# Patient Record
Sex: Female | Born: 1985 | Race: Black or African American | Hispanic: No | Marital: Married | Smoking: Never smoker
Health system: Southern US, Community
[De-identification: ages and names within clinical notes are randomized; demographics above are authoritative.]

## PROBLEM LIST (undated history)

## (undated) DIAGNOSIS — F319 Bipolar disorder, unspecified: Secondary | ICD-10-CM

## (undated) DIAGNOSIS — M1990 Unspecified osteoarthritis, unspecified site: Secondary | ICD-10-CM

## (undated) DIAGNOSIS — E119 Type 2 diabetes mellitus without complications: Secondary | ICD-10-CM

## (undated) DIAGNOSIS — K219 Gastro-esophageal reflux disease without esophagitis: Secondary | ICD-10-CM

## (undated) DIAGNOSIS — E079 Disorder of thyroid, unspecified: Secondary | ICD-10-CM

## (undated) DIAGNOSIS — R51 Headache: Secondary | ICD-10-CM

## (undated) DIAGNOSIS — I10 Essential (primary) hypertension: Secondary | ICD-10-CM

## (undated) DIAGNOSIS — R519 Headache, unspecified: Secondary | ICD-10-CM

## (undated) DIAGNOSIS — E039 Hypothyroidism, unspecified: Secondary | ICD-10-CM

## (undated) HISTORY — DX: Gastro-esophageal reflux disease without esophagitis: K21.9

## (undated) HISTORY — DX: Headache, unspecified: R51.9

## (undated) HISTORY — DX: Hypothyroidism, unspecified: E03.9

## (undated) HISTORY — DX: Bipolar disorder, unspecified: F31.9

## (undated) HISTORY — DX: Unspecified osteoarthritis, unspecified site: M19.90

## (undated) HISTORY — DX: Headache: R51

---

## 2013-06-26 ENCOUNTER — Emergency Department (HOSPITAL_COMMUNITY): Payer: Self-pay

## 2013-06-26 ENCOUNTER — Encounter (HOSPITAL_COMMUNITY): Payer: Self-pay | Admitting: *Deleted

## 2013-06-26 ENCOUNTER — Emergency Department (HOSPITAL_COMMUNITY)
Admission: EM | Admit: 2013-06-26 | Discharge: 2013-06-26 | Disposition: A | Discharge status: Home / Self Care | Payer: Self-pay | Attending: Emergency Medicine | Admitting: Emergency Medicine

## 2013-06-26 DIAGNOSIS — F319 Bipolar disorder, unspecified: Secondary | ICD-10-CM | POA: Insufficient documentation

## 2013-06-26 DIAGNOSIS — E119 Type 2 diabetes mellitus without complications: Secondary | ICD-10-CM | POA: Insufficient documentation

## 2013-06-26 DIAGNOSIS — R110 Nausea: Secondary | ICD-10-CM | POA: Insufficient documentation

## 2013-06-26 DIAGNOSIS — Z888 Allergy status to other drugs, medicaments and biological substances status: Secondary | ICD-10-CM | POA: Insufficient documentation

## 2013-06-26 DIAGNOSIS — Z794 Long term (current) use of insulin: Secondary | ICD-10-CM | POA: Insufficient documentation

## 2013-06-26 DIAGNOSIS — M545 Low back pain, unspecified: Secondary | ICD-10-CM | POA: Insufficient documentation

## 2013-06-26 DIAGNOSIS — M79609 Pain in unspecified limb: Secondary | ICD-10-CM | POA: Insufficient documentation

## 2013-06-26 DIAGNOSIS — Z79899 Other long term (current) drug therapy: Secondary | ICD-10-CM | POA: Insufficient documentation

## 2013-06-26 DIAGNOSIS — N938 Other specified abnormal uterine and vaginal bleeding: Secondary | ICD-10-CM | POA: Insufficient documentation

## 2013-06-26 DIAGNOSIS — N912 Amenorrhea, unspecified: Secondary | ICD-10-CM | POA: Insufficient documentation

## 2013-06-26 DIAGNOSIS — N8300 Follicular cyst of ovary, unspecified side: Secondary | ICD-10-CM

## 2013-06-26 DIAGNOSIS — L738 Other specified follicular disorders: Secondary | ICD-10-CM | POA: Insufficient documentation

## 2013-06-26 DIAGNOSIS — M549 Dorsalgia, unspecified: Secondary | ICD-10-CM

## 2013-06-26 DIAGNOSIS — R109 Unspecified abdominal pain: Secondary | ICD-10-CM | POA: Insufficient documentation

## 2013-06-26 DIAGNOSIS — R350 Frequency of micturition: Secondary | ICD-10-CM | POA: Insufficient documentation

## 2013-06-26 DIAGNOSIS — N949 Unspecified condition associated with female genital organs and menstrual cycle: Secondary | ICD-10-CM | POA: Insufficient documentation

## 2013-06-26 DIAGNOSIS — E079 Disorder of thyroid, unspecified: Secondary | ICD-10-CM | POA: Insufficient documentation

## 2013-06-26 DIAGNOSIS — Z3202 Encounter for pregnancy test, result negative: Secondary | ICD-10-CM | POA: Insufficient documentation

## 2013-06-26 HISTORY — DX: Disorder of thyroid, unspecified: E07.9

## 2013-06-26 HISTORY — DX: Bipolar disorder, unspecified: F31.9

## 2013-06-26 HISTORY — DX: Type 2 diabetes mellitus without complications: E11.9

## 2013-06-26 LAB — URINALYSIS, ROUTINE W REFLEX MICROSCOPIC
Glucose, UA: NEGATIVE mg/dL
Specific Gravity, Urine: 1.024 (ref 1.005–1.030)
Urobilinogen, UA: 1 mg/dL (ref 0.0–1.0)

## 2013-06-26 LAB — URINE MICROSCOPIC-ADD ON

## 2013-06-26 LAB — POCT PREGNANCY, URINE: Preg Test, Ur: NEGATIVE

## 2013-06-26 MED ORDER — KETOROLAC TROMETHAMINE 60 MG/2ML IM SOLN
60.0000 mg | Freq: Once | INTRAMUSCULAR | Status: AC
  Administered 2013-06-26: 60 mg via INTRAMUSCULAR
  Filled 2013-06-26: qty 2

## 2013-06-26 MED ORDER — HYDROCODONE-ACETAMINOPHEN 5-325 MG PO TABS: 1.0000 | ORAL_TABLET | ORAL | Status: DC | PRN

## 2013-06-26 NOTE — ED Notes (Signed)
Pt is here with lower back pain that radiates down to legs.  Pt is here with complaints of yesterday feeling movement in her abdomen and a big bulge. Pt had negative pregnancy test.  LMP 04/12/13.

## 2013-06-26 NOTE — ED Notes (Signed)
Robyn, PA at bedside  

## 2013-06-26 NOTE — ED Provider Notes (Signed)
History    CSN: [id number] Arrival date & time 06/26/13  1247  First MD Initiated Contact with Patient 06/26/13 1537     Chief Complaint  Patient presents with  . Back Pain   (Consider location/radiation/quality/duration/timing/severity/associated sxs/prior Treatment) HPI Comments: 27 year old female with a past medical history of diabetes, hypothyroidism, bipolar disorder presents to the emergency department complaining of left-sided lower back pain radiating down her left leg beginning 4 days ago. Patient states she cannot recall what she was doing when the pain began, described as constant, achy rated 7/10. Try taking ibuprofen without relief. Radiation down her left leg more prominent when she goes from a seated to standing position. Denies numbness or tingling down her extremities. Admits to associated increased urinary frequency without urgency or dysuria. States she has not had a menstrual period since April 23, took multiple pregnancy tests which were negative. Last night she had a "moving" feeling in the left side of her abdomen that moved when she tried to touch the area which was bulging. States it feels like she had fluttering. She has had some vaginal spotting over the past few days. Denies nausea, vomiting, fever or chills. No loss of control of bowels or bladder or saddle anesthesia. States she is moving her bowels normally.  Patient is a 27 y.o. female presenting with back pain. The history is provided by the patient.  Back Pain Associated symptoms: no abdominal pain, no dysuria and no fever    Past Medical History  Diagnosis Date  . Diabetes mellitus without complication   . Thyroid disease   . Bipolar 1 disorder    History reviewed. No pertinent past surgical history. No family history on file. History  Substance Use Topics  . Smoking status: Never Smoker   . Smokeless tobacco: Not on file  . Alcohol Use: No   OB History   Grav Para Term Preterm Abortions TAB SAB  Ect Mult Living                 Review of Systems  Constitutional: Negative for fever and chills.  Gastrointestinal: Positive for nausea. Negative for vomiting and abdominal pain.  Genitourinary: Positive for frequency, vaginal bleeding and menstrual problem. Negative for dysuria, urgency and difficulty urinating.  Musculoskeletal: Positive for back pain. Negative for gait problem.  All other systems reviewed and are negative.    Allergies  Flonase and Naproxen  Home Medications   Current Outpatient Rx  Name  Route  Sig  Dispense  Refill  . Insulin Aspart (NOVOLOG FLEXPEN [place])   Subcutaneous   Inject 2-8 Units into the skin. 6-8 units with meals.         . Insulin Glargine (LANTUS SOLOSTAR) 100 UNIT/ML SOPN   Subcutaneous   Inject 42 Units into the skin at bedtime.         Marland Kitchen levothyroxine (SYNTHROID, LEVOTHROID) 200 MCG tablet   Oral   Take 200 mcg by mouth daily before breakfast.         . lovastatin (MEVACOR) 20 MG tablet   Oral   Take 20 mg by mouth at bedtime.         . metFORMIN (GLUCOPHAGE) 500 MG tablet   Oral   Take 500 mg by mouth daily.          BP 134/81  Pulse 72  Temp(Src) 98.6 F (37 C) (Oral)  Resp 18  SpO2 97%  LMP 04/12/2013 Physical Exam  Nursing note and vitals reviewed. Constitutional:  She is oriented to person, place, and time. She appears well-developed. No distress.  Obese  HENT:  Head: Normocephalic and atraumatic.  Mouth/Throat: Oropharynx is clear and moist.  Eyes: Conjunctivae are normal.  Neck: Normal range of motion. Neck supple.  Cardiovascular: Normal rate, regular rhythm, normal heart sounds and intact distal pulses.   Pulmonary/Chest: Effort normal and breath sounds normal.  Abdominal: Soft. Bowel sounds are normal. She exhibits no distension. There is tenderness. There is no rigidity, no rebound, no guarding and no CVA tenderness.    Genitourinary: Uterus is not tender. Cervix exhibits no motion tenderness, no  discharge and no friability. Right adnexum displays no mass, no tenderness and no fullness. Left adnexum displays tenderness. Left adnexum displays no mass and no fullness. No erythema, tenderness or bleeding around the vagina. No vaginal discharge found.  Pelvic exam limited by patient's body habitus.  Musculoskeletal: Normal range of motion. She exhibits no edema.       Lumbar back: She exhibits tenderness. She exhibits normal range of motion, no bony tenderness and normal pulse.       Back:  Neurological: She is alert and oriented to person, place, and time. She has normal strength. No sensory deficit. Gait normal.  Skin: Skin is warm and dry. She is not diaphoretic.  Psychiatric: She has a normal mood and affect. Her behavior is normal.    ED Course  Procedures (including critical care time) Labs Reviewed  URINALYSIS, ROUTINE W REFLEX MICROSCOPIC - Abnormal; Notable for the following:    Color, Urine AMBER (*)    APPearance HAZY (*)    Hgb urine dipstick MODERATE (*)    Bilirubin Urine SMALL (*)    Ketones, ur 15 (*)    All other components within normal limits  URINE MICROSCOPIC-ADD ON - Abnormal; Notable for the following:    Squamous Epithelial / LPF FEW (*)    Bacteria, UA FEW (*)    All other components within normal limits  WET PREP, GENITAL  GC/CHLAMYDIA PROBE AMP  POCT PREGNANCY, URINE   US Transvaginal Non-ob  06/26/2013   *RADIOLOGY REPORT*  Clinical Data: Back and pelvic pain/cramping.  TRANSABDOMINAL AND TRANSVAGINAL ULTRASOUND OF PELVIS Technique:  Both transabdominal and transvaginal ultrasound examinations of the pelvis were performed. Transabdominal technique was performed for global imaging of the pelvis including uterus, ovaries, adnexal regions, and pelvic cul-de-sac.  It was necessary to proceed with endovaginal exam following the transabdominal exam to visualize the uterus, ovaries, and adnexa  .  Comparison:  None  Findings:  Uterus: 8.2 x 3.7 x 5.7 cm. Normal  in morphology.  Endometrium: Normal, 2 mm.  Right ovary:  4.5 x 2.6 x 2.4 cm. Normal in morphology.  Left ovary: 4.4 x 2.5 x 3.2 cm.  Dominant follicle at 2.5 x 2.1 x 2.5 cm.  Other findings: No free fluid  IMPRESSION: Left ovarian dominant follicle of 2.5 cm.  Otherwise, normal pelvic ultrasound for age.   Original Report Authenticated By: Jeronimo Greaves, M.D.   US Pelvis Complete  06/26/2013   *RADIOLOGY REPORT*  Clinical Data: Back and pelvic pain/cramping.  TRANSABDOMINAL AND TRANSVAGINAL ULTRASOUND OF PELVIS Technique:  Both transabdominal and transvaginal ultrasound examinations of the pelvis were performed. Transabdominal technique was performed for global imaging of the pelvis including uterus, ovaries, adnexal regions, and pelvic cul-de-sac.  It was necessary to proceed with endovaginal exam following the transabdominal exam to visualize the uterus, ovaries, and adnexa  .  Comparison:  None  Findings:  Uterus: 8.2 x 3.7 x 5.7 cm. Normal in morphology.  Endometrium: Normal, 2 mm.  Right ovary:  4.5 x 2.6 x 2.4 cm. Normal in morphology.  Left ovary: 4.4 x 2.5 x 3.2 cm.  Dominant follicle at 2.5 x 2.1 x 2.5 cm.  Other findings: No free fluid  IMPRESSION: Left ovarian dominant follicle of 2.5 cm.  Otherwise, normal pelvic ultrasound for age.   Original Report Authenticated By: Jeronimo Greaves, M.D.   1. Follicle cyst   2. Back pain     MDM  Patient with back pain, lower abdominal tenderness in pelvic region, vaginal spotting, amenorrhea. Pregnancy negative, UA without infection. Will perform pelvic exam, obtain cultures, pelvic US. 5:21 PM Pelvic exam limited by patient's body habitus. No CMT. Left adnexal tenderness. US showing L dominant follicle of 2.5 cm. Pain improved with toradol. She is stable for discharge, f/u with GYN. Resources for PCP given. Vicodin for pain. Return precautions discussed. Patient states understanding of plan and is agreeable.   Trevor Mace, PA-C 06/26/13 1722

## 2013-06-26 NOTE — ED Notes (Signed)
Pt alert and mentating appropriately upon d/c. Pt given d/c teaching and prescriptions. Pt verbalizes understanding and has no further questions upon d/c. Pt given resource guide and educated on use and follow up care instructions. NAD noted upon d/c. Pt ambulatory upon d/c. Pt states they are taking the bus home.

## 2013-06-27 LAB — GC/CHLAMYDIA PROBE AMP: GC Probe RNA: NEGATIVE

## 2013-06-27 NOTE — ED Provider Notes (Signed)
  Medical screening examination/treatment/procedure(s) were performed by non-physician practitioner and as supervising physician I was immediately available for consultation/collaboration.    Gerhard Munch, MD 06/27/13 0030

## 2014-01-06 ENCOUNTER — Encounter (HOSPITAL_COMMUNITY): Payer: Self-pay | Admitting: Emergency Medicine

## 2014-01-06 ENCOUNTER — Emergency Department (HOSPITAL_COMMUNITY)
Admission: EM | Admit: 2014-01-06 | Discharge: 2014-01-06 | Disposition: A | Discharge status: Home / Self Care | Payer: Medicaid - Out of State | Attending: Emergency Medicine | Admitting: Emergency Medicine

## 2014-01-06 DIAGNOSIS — M545 Low back pain, unspecified: Secondary | ICD-10-CM | POA: Insufficient documentation

## 2014-01-06 DIAGNOSIS — G8929 Other chronic pain: Secondary | ICD-10-CM | POA: Insufficient documentation

## 2014-01-06 DIAGNOSIS — Z794 Long term (current) use of insulin: Secondary | ICD-10-CM | POA: Insufficient documentation

## 2014-01-06 DIAGNOSIS — E119 Type 2 diabetes mellitus without complications: Secondary | ICD-10-CM | POA: Insufficient documentation

## 2014-01-06 DIAGNOSIS — R52 Pain, unspecified: Secondary | ICD-10-CM | POA: Insufficient documentation

## 2014-01-06 DIAGNOSIS — Z8659 Personal history of other mental and behavioral disorders: Secondary | ICD-10-CM | POA: Insufficient documentation

## 2014-01-06 DIAGNOSIS — E079 Disorder of thyroid, unspecified: Secondary | ICD-10-CM | POA: Insufficient documentation

## 2014-01-06 DIAGNOSIS — Z79899 Other long term (current) drug therapy: Secondary | ICD-10-CM | POA: Insufficient documentation

## 2014-01-06 MED ORDER — IBUPROFEN 800 MG PO TABS: 800.0000 mg | ORAL_TABLET | Freq: Three times a day (TID) | ORAL | Status: DC

## 2014-01-06 MED ORDER — METHOCARBAMOL 500 MG PO TABS: 500.0000 mg | ORAL_TABLET | Freq: Two times a day (BID) | ORAL | Status: DC

## 2014-01-06 MED ORDER — KETOROLAC TROMETHAMINE 30 MG/ML IJ SOLN
30.0000 mg | Freq: Once | INTRAMUSCULAR | Status: AC
  Administered 2014-01-06: 30 mg via INTRAMUSCULAR
  Filled 2014-01-06: qty 1

## 2014-01-06 MED ORDER — TRAMADOL HCL 50 MG PO TABS: 50.0000 mg | ORAL_TABLET | Freq: Four times a day (QID) | ORAL | Status: DC | PRN

## 2014-01-06 NOTE — ED Provider Notes (Signed)
CSN: [id number]     Arrival date & time 01/06/14  1724 History  This chart was scribed for Santiago Glad, PA, working with Gavin Pound. Oletta Lamas, MD, by Ardelia Mems ED Scribe. This patient was seen in room TR09C/TR09C and the patient's care was started at 6:05 PM.   Chief Complaint  Patient presents with  . Back Pain    The history is provided by the patient. No language interpreter was used.    HPI Comments: Sierra [name] is a 28 y.o. female with a history of chronic, constant, waxing and waning lower back pain over the past 1.5 years who presents to the Emergency Department complaining of acutely worsened lower back pain over the past week. She denies any known injury or exertion to have onset her back pain at any time. She states that her lower back pain occasionally radiates to upper back and buttocks areas. She states that she has tried Motrin without relief of her pain. She states that she was evaluated for this pain and had an MRI about 1 year ago in [place], and that she was told that she had disc deterioration and possibly pinched nerves in her back. She states she has never seen a Midwife. She denies fever, chills, numbness, weakness, bowel or bladder incontinence or any other symptoms.   PCP- None  Past Medical History  Diagnosis Date  . Diabetes mellitus without complication   . Thyroid disease   . Bipolar 1 disorder    History reviewed. No pertinent past surgical history. No family history on file. History  Substance Use Topics  . Smoking status: Never Smoker   . Smokeless tobacco: Not on file  . Alcohol Use: No   OB History   Grav Para Term Preterm Abortions TAB SAB Ect Mult Living                 Review of Systems  Constitutional: Negative for fever and chills.  Gastrointestinal:       Denies bowel incontinence  Genitourinary:       Denies bladder incontinence  Musculoskeletal: Positive for back pain.  Neurological: Negative for weakness and numbness.   All other systems reviewed and are negative.   Allergies  Flonase and Naproxen  Home Medications   Current Outpatient Rx  Name  Route  Sig  Dispense  Refill  . Insulin Aspart (NOVOLOG FLEXPEN [place])   Subcutaneous   Inject 2-8 Units into the skin. 6-8 units with meals.         . Insulin Glargine (LANTUS SOLOSTAR) 100 UNIT/ML SOPN   Subcutaneous   Inject 42 Units into the skin at bedtime.         Marland Kitchen levothyroxine (SYNTHROID, LEVOTHROID) 200 MCG tablet   Oral   Take 200 mcg by mouth daily before breakfast.         . lovastatin (MEVACOR) 20 MG tablet   Oral   Take 20 mg by mouth at bedtime.         . metFORMIN (GLUCOPHAGE) 500 MG tablet   Oral   Take 500 mg by mouth daily.          Triage Vitals: BP 152/70  Pulse 75  Temp(Src) 99.4 F (37.4 C) (Oral)  Resp 20  Ht 5\' 6"  (1.676 m)  Wt 283 lb (128.368 kg)  BMI 45.70 kg/m2  SpO2 100%  LMP 12/10/2013  Physical Exam  Nursing note and vitals reviewed. Constitutional: She is oriented to person, place, and time. She appears  well-developed and well-nourished. No distress.  HENT:  Head: Normocephalic and atraumatic.  Eyes: EOM are normal.  Neck: Neck supple. No tracheal deviation present.  Cardiovascular: Normal rate and intact distal pulses.   2+ DP pulses bilaterally.   Pulmonary/Chest: Effort normal. No respiratory distress.  Musculoskeletal: Normal range of motion. She exhibits tenderness.  Lumbar spine tenderness to palpation. No thoracic tenderness. No step-offs or deformities.  Neurological: She is alert and oriented to person, place, and time. Gait normal.  2+ patellar reflexes bilaterally. Muscle strength of lower extremities is normal. Distal sensation of both feet is intact.   Skin: Skin is warm and dry.  Psychiatric: She has a normal mood and affect. Her behavior is normal.    ED Course  Procedures (including critical care time)  DIAGNOSTIC STUDIES: Oxygen Saturation is 100% on RA, normal by my  interpretation.    COORDINATION OF CARE: 6:14 PM- Discussed plan to order Toradol. Will also give pt resources for finding a PCP. Pt advised of plan for treatment and pt agrees.  Medications  ketorolac (TORADOL) 30 MG/ML injection 30 mg (30 mg Intramuscular Given 01/06/14 1818)   Labs Review Labs Reviewed - No data to display Imaging Review No results found.  EKG Interpretation   None       MDM   1. Lower back pain    Patient with back pain.  No neurological deficits and normal neuro exam.  Patient can walk but states is painful.  No loss of bowel or bladder control.  No concern for cauda equina.  No fever, night sweats, weight loss, h/o cancer, IVDU.  RICE protocol and pain medicine indicated and discussed with patient.   I personally performed the services described in this documentation, which was scribed in my presence. The recorded information has been reviewed and is accurate.    Santiago GladHeather Kairav Russomanno, PA-C 01/06/14 1849

## 2014-01-06 NOTE — ED Notes (Signed)
Pt has chronic back pain which she was seen for this past July. 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 States she has been taking ibuprofen for the pain without relief.

## 2014-01-06 NOTE — Discharge Instructions (Signed)
°Emergency Department Resource Guide °1) Find a Doctor and Pay Out of Pocket °Although you won't have to find out who is covered by your insurance plan, it is a good idea to ask around and get recommendations. You will then need to call the office and see if the doctor you have chosen will accept you as a new patient and what types of options they offer for patients who are self-pay. Some doctors offer discounts or will set up payment plans for their patients who do not have insurance, but you will need to ask so you aren't surprised when you get to your appointment. ° °2) Contact Your Local Health Department °Not all health departments have doctors that can see patients for sick visits, but many do, so it is worth a call to see if yours does. If you don't know where your local health department is, you can check in your phone book. The CDC also has a tool to help you locate your state's health department, and many state websites also have listings of all of their local health departments. ° °3) Find a Walk-in Clinic °If your illness is not likely to be very severe or complicated, you may want to try a walk in clinic. These are popping up all over the country in pharmacies, drugstores, and shopping centers. They're usually staffed by nurse practitioners or physician assistants that have been trained to treat common illnesses and complaints. They're usually fairly quick and inexpensive. However, if you have serious medical issues or chronic medical problems, these are probably not your best option. ° °No Primary Care Doctor: °- Call [facility] at  832-8000 - they can help you locate a primary care doctor that  accepts your insurance, provides certain services, etc. °- Physician Referral Service- 1-800-533-3463 ° °Chronic Pain Problems: °Organization         Address  Phone   Notes  °[facility]  (336) 297-2271 Patients need to be referred by their primary care doctor.  ° °Medication  Assistance: °Organization         Address  Phone   Notes  °[place] [facility] [address] °[place], [place] [address] (336) 641-8030 --Must be a resident of [place] °-- Must have NO insurance coverage whatsoever (no Medicaid/ Medicare, etc.) °-- The pt. MUST have a primary care doctor that directs their care regularly and follows them in the community °  °[facility]  (866) 331-1348   °[employer]  (888) 892-1162   ° °Agencies that provide inexpensive medical care: °Organization         Address  Phone   Notes  °[facility]  (336) 832-8035   °[facility]    (336) 832-7272   °[facility] [address] °[place], [place] [address] (336) 832-4777   °[facility] [address], °[place] (336) 271-4999   °[facility]    (336) 373-0678   °[facility]    (336) 272-1050   °[facility] ° [address], [place] Phone:  (336) 832-4444, Fax:  (336) 832-4440 Hours of Operation:  9 am - 6 pm, M-F.  Also accepts Medicaid/Medicare and self-pay.  °[facility] ° [address], [place] Phone: (336) 832-3150, Fax: (336) 832-3151. Hours of Operation:  8:30 am - 5:30 pm, M-F.  Also accepts Medicaid and self-pay.  °[facility] [place] [address], [place] Phone: (336) 878-6027   °[facility] [address], [place], [place] (336)723-1848, Ext. 123 Mondays & Thursdays: 7-9 AM.  First 15 patients are seen on a first come, first serve basis. °  ° °Medicaid-accepting [place] Providers: ° °Organization         Address  Phone   Notes  °[facility] [address], [place] (336) 641-2100 Also accepts self-pay patients.  °[facility] [address], [place] ° (336) 856-9996   °[facility] [address], [place]  (336) 288-8857   °[facility] [address] [place] [address], [place] (336) 299-7000   °Veita Bland [address], [place]  ° (336) 373-1557 Only accepts [place] Access Medicaid patients after they have their name applied to their card.  ° °Self-Pay (no insurance) in [place]: ° °Organization         Address  Phone   Notes  °Sickle Cell Patients, [facility] [address], [place] (336) 832-1970   °[facility] [address], [place] (336) 832-4400   °[facility] ° [address],  (336) 992-4800   °[facility]/Dr. Osei-Bonsu ° [address] [place] [address], [place] or [address], [place] (336) 841-8500 Phone number for both [place] and [place] locations is the same.  °[facility] [address], [place] (336) 299-0000   °[facility] [address] [place] [address], [place] or [address] (336) 852-7530 °(336) 878-2260   °[facility] [address], [place] (336) 350-1642, phone; (336) 294-5005, fax Sees patients 1st and 3rd Saturday of every month.  Must not qualify for public or private insurance (i.e. Medicaid, Medicare, [place] Health Choice, Veterans' Benefits) • Household income should be no more than 200% of the poverty level •The clinic cannot treat you if you are pregnant or think you are pregnant • Sexually transmitted diseases are not treated at the clinic.  ° ° °Dental Care: °Organization         Address  Phone  Notes  °[place] [facility] [address],  (336) 641-6152 Accepts children up to age 21 who are enrolled in Medicaid or [place] Health Choice; pregnant women with a Medicaid card; and children who have applied for Medicaid or [place] Health Choice, but were declined, whose parents can pay a reduced fee at time of service.  °[place]  [facility] [place]  [address], [place] (336) 641-7733 Accepts children up to age 21 who are enrolled in Medicaid or [place] Health Choice; pregnant women with a Medicaid card; and children who have applied for Medicaid or [place] Health Choice, but were declined, whose parents can pay a reduced fee at time of service.  °[facility] ° [address],  (336) 641-4533 Patients are seen by appointment only. Walk-ins are not accepted. [facility] will see patients 18 years of age and older. °Monday - Tuesday (8am-5pm) °Most Wednesdays (8:30-5pm) °$30 per visit, cash only  °[facility] ° [address], [place] (336) 641-4533 Patients are seen by appointment only. Walk-ins are not accepted. [facility] will see patients 18 years of age and older. °One   Wednesday Evening (Monthly: Volunteer Based).  $30 per visit, cash only  °[facility]  (919) 537-3737 for adults; Children under age 4, call Graduate Pediatric Dentistry at (919) [phone]. Children aged 4-14, please call (919) [phone] to request a pediatric application. ° Dental services are provided in all areas of dental care including fillings, crowns and bridges, complete and partial dentures, implants, gum treatment, root canals, and extractions. Preventive care is also provided. Treatment is provided to both adults and children. °Patients are selected via a lottery and there is often a waiting list. °  °[facility] [address], °[place] ° (336) 763-8833 [email or web address] °  °[facility] [address], [place], [place] (336)723-1848, Ext. 123 Second and Fourth Thursday of each month, opens at 6:30 AM; Clinic ends at 9 AM.  Patients are seen on a first-come first-served basis, and a limited number are seen during each clinic.  ° °[facility] ° [address], [place], [place] (336) 723-7904    Eligibility Requirements °You must have lived in [place], or [place] counties for at least the last three months. °  You cannot be eligible for state or federal sponsored healthcare insurance, including Veterans Administration, Medicaid, or Medicare. °  You generally cannot be eligible for healthcare insurance through your employer.  °  How to apply: °Eligibility screenings are held every Tuesday and Wednesday afternoon from 1:00 pm until 4:00 pm. You do not need an appointment for the interview!  °[facility] [address], [place], [place] 336-631-2330   °[facility]  336-342-8273   °[facility]  336-703-3100   °[facility]  336-570-6415   ° °Behavioral Health Resources in the Community: °Intensive Outpatient Programs °Organization         Address  Phone  Notes  °[facility] [address], [place], [place] 336-878-6098   °[facility] [address], [place], [place] 336-832-9800   °[facility] [address], [place], [place] ° 336-882-2125   °[facility] [address],  °[place], [place] 1-800-853-5163 or 336-641-4981   °Substance Abuse Resources °Organization         Address  Phone  Notes  °[facility]  336-882-2125   °[facility]  336-784-9470   °[facility] [place] [facility]  336-285-9073   °[facility]  336-845-3988   °Residential & Outpatient Substance Abuse Program  1-800-659-3381   °Psychological Services °Organization         Address  Phone  Notes  °[facility]  336- 832-9600   °[facility]  336- 378-7881   °[facility] [address], [place] 1-800-853-5163 or 336-641-4981   ° °Mobile Crisis Teams °Organization         Address  Phone  Notes  °[facility], Mobile Crisis Care Unit  1-877-626-1772   °Assertive °[facility] ° [address]  [place], [place] 336-834-9664   °Sharon DeEsch [address] °[place] 336-554-5454   ° °Self-Help/Support Groups °Organization         Address  Phone             Notes  °[facility] [place] - variety of support groups  336- 373-1402 Call for more information  °Narcotics Anonymous (NA), [facility] [address], °[place]   2 meetings at this location  ° °  Residential Treatment Programs °Organization         Address  Phone  Notes  °[facility] [address],    °[place]  1-866-801-8205   °[facility] ° [address], [place], [place] 704-293-8524   °[facility] [address], [place] 336-845-3988 Admissions: 8am-3pm M-F  °[facility] [address]    °[place], [place] 336-841-1104   °[facility] [address], [place], [place] 336-379-7146   °[facility] [address]  °[place], [place] 336-285-9073   °[facility] Intensive Outpatient [address], [place], [place] 336-852-3033   °[facility] [address]  °[place], [place] 1-877-615-2722 or 336-784-9470   °[facility] [address] [place], [place] 336-227-7417 Accepts Medicaid  °[facility] [address]  °[place] 1-800-659-3381 Substance Abuse/Addiction Treatment  ° °[place] Behavioral Health Resources °Organization         Address  Phone  Notes  °[facility]  (888) 581-9988   °Julie Brannon, PhD [address] [place], [place]   (336) 349-5553 or (336) 951-0000   ° Behavioral   [address] °[place], [place] (336) 349-4454   °[facility] [address], [place], [place] (336) 342-8316 Insurance/Medicaid/sponsorship through [facility]  °[facility] [address]                                    [place], [place] (336) 342-8316 Therapy/tele-psych/case    °[facility] [address]  ° [place], [place] (336) 349-2233    °Dr. Arfeen  (336) 349-4544   °[facility] [place]  [employer] [place] [facility] 1) [address],  °2) [address], [place] °3)  [address], [place] (336) 349-3220 °(336) 342-7768 ° °(336) 342-8140   °[place] [facility] (336) 342-1394 or (336) 342-3537 (After Hours)    ° ° °

## 2014-01-06 NOTE — ED Notes (Signed)
Pt out to nurses station requesting to be discharged before the bus comes. Pt reports pain is unrelieved.

## 2014-01-06 NOTE — ED Notes (Signed)
Pt states history of lower back pain.  Pt with increasing back pain starting the beginning of the week.  Pt currently rates pain 8/10.

## 2014-01-07 NOTE — ED Provider Notes (Signed)
Medical screening examination/treatment/procedure(s) were performed by non-physician practitioner and as supervising physician I was immediately available for consultation/collaboration.  EKG Interpretation   None         Travers Goodley Y. Audery Wassenaar, MD 01/07/14 2356 

## 2014-05-22 LAB — OB RESULTS CONSOLE ABO/RH: RH Type: NEGATIVE

## 2014-05-22 LAB — OB RESULTS CONSOLE RPR: RPR: NONREACTIVE

## 2014-05-22 LAB — OB RESULTS CONSOLE HIV ANTIBODY (ROUTINE TESTING): HIV: NONREACTIVE

## 2014-05-22 LAB — OB RESULTS CONSOLE HEPATITIS B SURFACE ANTIGEN: HEP B S AG: NEGATIVE

## 2014-05-22 LAB — OB RESULTS CONSOLE ANTIBODY SCREEN: ANTIBODY SCREEN: NEGATIVE

## 2014-05-22 LAB — OB RESULTS CONSOLE GC/CHLAMYDIA
Chlamydia: NEGATIVE
Gonorrhea: NEGATIVE

## 2014-05-22 LAB — OB RESULTS CONSOLE RUBELLA ANTIBODY, IGM: RUBELLA: NON-IMMUNE/NOT IMMUNE

## 2014-11-06 LAB — OB RESULTS CONSOLE GBS: GBS: POSITIVE

## 2014-11-14 ENCOUNTER — Other Ambulatory Visit: Payer: Self-pay | Admitting: Obstetrics and Gynecology

## 2014-11-16 ENCOUNTER — Inpatient Hospital Stay (HOSPITAL_COMMUNITY)
Admission: AD | Admit: 2014-11-16 | Discharge: 2014-11-16 | Disposition: A | Discharge status: Home / Self Care | Payer: Medicaid Other | Source: Ambulatory Visit | Attending: Obstetrics and Gynecology | Admitting: Obstetrics and Gynecology

## 2014-11-16 DIAGNOSIS — O24410 Gestational diabetes mellitus in pregnancy, diet controlled: Secondary | ICD-10-CM | POA: Diagnosis not present

## 2014-11-16 DIAGNOSIS — Z3A00 Weeks of gestation of pregnancy not specified: Secondary | ICD-10-CM | POA: Diagnosis not present

## 2014-11-16 NOTE — MAU Provider Note (Signed)
Pt here for NST for GDM NST Category 1 No decels and 15 x 15 accels

## 2014-11-23 ENCOUNTER — Telehealth (HOSPITAL_COMMUNITY): Payer: Self-pay | Admitting: *Deleted

## 2014-11-23 ENCOUNTER — Encounter (HOSPITAL_COMMUNITY): Payer: Self-pay | Admitting: *Deleted

## 2014-11-23 NOTE — Telephone Encounter (Signed)
Preadmission screen  

## 2014-11-29 ENCOUNTER — Inpatient Hospital Stay (HOSPITAL_COMMUNITY)
Admission: RE | Admit: 2014-11-29 | Discharge: 2014-12-03 | DRG: 766 | Disposition: A | Discharge status: Home / Self Care | Payer: Medicaid Other | Source: Ambulatory Visit | Attending: Obstetrics and Gynecology | Admitting: Obstetrics and Gynecology

## 2014-11-29 ENCOUNTER — Telehealth (HOSPITAL_COMMUNITY): Payer: Self-pay | Admitting: *Deleted

## 2014-11-29 ENCOUNTER — Encounter (HOSPITAL_COMMUNITY): Payer: Self-pay

## 2014-11-29 DIAGNOSIS — E039 Hypothyroidism, unspecified: Secondary | ICD-10-CM | POA: Diagnosis present

## 2014-11-29 DIAGNOSIS — O99824 Streptococcus B carrier state complicating childbirth: Secondary | ICD-10-CM | POA: Diagnosis present

## 2014-11-29 DIAGNOSIS — O24424 Gestational diabetes mellitus in childbirth, insulin controlled: Secondary | ICD-10-CM | POA: Diagnosis present

## 2014-11-29 DIAGNOSIS — Z3483 Encounter for supervision of other normal pregnancy, third trimester: Secondary | ICD-10-CM | POA: Diagnosis present

## 2014-11-29 DIAGNOSIS — Z3A38 38 weeks gestation of pregnancy: Secondary | ICD-10-CM | POA: Diagnosis present

## 2014-11-29 DIAGNOSIS — O99284 Endocrine, nutritional and metabolic diseases complicating childbirth: Secondary | ICD-10-CM | POA: Diagnosis present

## 2014-11-29 DIAGNOSIS — O133 Gestational [pregnancy-induced] hypertension without significant proteinuria, third trimester: Secondary | ICD-10-CM | POA: Diagnosis present

## 2014-11-29 DIAGNOSIS — O139 Gestational [pregnancy-induced] hypertension without significant proteinuria, unspecified trimester: Secondary | ICD-10-CM | POA: Diagnosis present

## 2014-11-29 DIAGNOSIS — Z98891 History of uterine scar from previous surgery: Secondary | ICD-10-CM

## 2014-11-29 LAB — CBC
HEMATOCRIT: 34.8 % — AB (ref 36.0–46.0)
Hemoglobin: 11.6 g/dL — ABNORMAL LOW (ref 12.0–15.0)
MCH: 26.2 pg (ref 26.0–34.0)
MCHC: 33.3 g/dL (ref 30.0–36.0)
MCV: 78.6 fL (ref 78.0–100.0)
Platelets: 137 10*3/uL — ABNORMAL LOW (ref 150–400)
RBC: 4.43 MIL/uL (ref 3.87–5.11)
RDW: 17.1 % — ABNORMAL HIGH (ref 11.5–15.5)
WBC: 9.5 10*3/uL (ref 4.0–10.5)

## 2014-11-29 LAB — COMPREHENSIVE METABOLIC PANEL
ALK PHOS: 98 U/L (ref 39–117)
ALT: 12 U/L (ref 0–35)
ANION GAP: 15 (ref 5–15)
AST: 18 U/L (ref 0–37)
Albumin: 2.6 g/dL — ABNORMAL LOW (ref 3.5–5.2)
BUN: 8 mg/dL (ref 6–23)
CO2: 19 meq/L (ref 19–32)
Calcium: 9.1 mg/dL (ref 8.4–10.5)
Chloride: 102 mEq/L (ref 96–112)
Creatinine, Ser: 0.58 mg/dL (ref 0.50–1.10)
GLUCOSE: 77 mg/dL (ref 70–99)
Potassium: 4.6 mEq/L (ref 3.7–5.3)
SODIUM: 136 meq/L — AB (ref 137–147)
Total Bilirubin: 0.3 mg/dL (ref 0.3–1.2)
Total Protein: 6.4 g/dL (ref 6.0–8.3)

## 2014-11-29 LAB — GLUCOSE, CAPILLARY: Glucose-Capillary: 86 mg/dL (ref 70–99)

## 2014-11-29 MED ORDER — ACETAMINOPHEN 325 MG PO TABS: 650.0000 mg | ORAL_TABLET | ORAL | Status: DC | PRN

## 2014-11-29 MED ORDER — OXYTOCIN 40 UNITS IN LACTATED RINGERS INFUSION - SIMPLE MED
1.0000 m[IU]/min | INTRAVENOUS | Status: DC
  Filled 2014-11-29: qty 1000

## 2014-11-29 MED ORDER — CITRIC ACID-SODIUM CITRATE 334-500 MG/5ML PO SOLN
30.0000 mL | ORAL | Status: DC | PRN
  Administered 2014-11-30: 30 mL via ORAL
  Filled 2014-11-29: qty 15

## 2014-11-29 MED ORDER — OXYTOCIN BOLUS FROM INFUSION: 500.0000 mL | INTRAVENOUS | Status: DC

## 2014-11-29 MED ORDER — DEXTROSE 5 % IV SOLN
2.5000 10*6.[IU] | INTRAVENOUS | Status: DC
  Administered 2014-11-30 (×2): 2.5 10*6.[IU] via INTRAVENOUS
  Filled 2014-11-29 (×5): qty 2.5

## 2014-11-29 MED ORDER — OXYCODONE-ACETAMINOPHEN 5-325 MG PO TABS: 2.0000 | ORAL_TABLET | ORAL | Status: DC | PRN

## 2014-11-29 MED ORDER — MISOPROSTOL 25 MCG QUARTER TABLET
25.0000 ug | ORAL_TABLET | ORAL | Status: DC | PRN
  Administered 2014-11-29 – 2014-11-30 (×2): 25 ug via VAGINAL
  Filled 2014-11-29 (×2): qty 0.25

## 2014-11-29 MED ORDER — INSULIN GLARGINE 100 UNIT/ML ~~LOC~~ SOLN
20.0000 [IU] | Freq: Every day | SUBCUTANEOUS | Status: DC
  Administered 2014-11-29: 20 [IU] via SUBCUTANEOUS
  Filled 2014-11-29 (×2): qty 0.2

## 2014-11-29 MED ORDER — OXYTOCIN 40 UNITS IN LACTATED RINGERS INFUSION - SIMPLE MED: 62.5000 mL/h | INTRAVENOUS | Status: DC

## 2014-11-29 MED ORDER — ONDANSETRON HCL 4 MG/2ML IJ SOLN
4.0000 mg | Freq: Four times a day (QID) | INTRAMUSCULAR | Status: DC | PRN
  Administered 2014-11-30: 4 mg via INTRAVENOUS
  Filled 2014-11-29: qty 2

## 2014-11-29 MED ORDER — BUTORPHANOL TARTRATE 1 MG/ML IJ SOLN
1.0000 mg | INTRAMUSCULAR | Status: DC | PRN
  Administered 2014-11-30: 1 mg via INTRAVENOUS
  Filled 2014-11-29: qty 1

## 2014-11-29 MED ORDER — LACTATED RINGERS IV SOLN: 500.0000 mL | INTRAVENOUS | Status: DC | PRN

## 2014-11-29 MED ORDER — PENICILLIN G POTASSIUM 5000000 UNITS IJ SOLR
5.0000 10*6.[IU] | Freq: Once | INTRAVENOUS | Status: AC
  Administered 2014-11-30: 5 10*6.[IU] via INTRAVENOUS
  Filled 2014-11-29: qty 5

## 2014-11-29 MED ORDER — HYDROXYZINE HCL 50 MG/ML IM SOLN
50.0000 mg | Freq: Every evening | INTRAMUSCULAR | Status: DC | PRN
  Filled 2014-11-29: qty 1

## 2014-11-29 MED ORDER — OXYCODONE-ACETAMINOPHEN 5-325 MG PO TABS
1.0000 | ORAL_TABLET | ORAL | Status: DC | PRN
  Administered 2014-11-30: 1 via ORAL
  Filled 2014-11-29: qty 1

## 2014-11-29 MED ORDER — LIDOCAINE HCL (PF) 1 % IJ SOLN: 30.0000 mL | INTRAMUSCULAR | Status: DC | PRN

## 2014-11-29 MED ORDER — TERBUTALINE SULFATE 1 MG/ML IJ SOLN: 0.2500 mg | Freq: Once | INTRAMUSCULAR | Status: AC | PRN

## 2014-11-29 MED ORDER — LACTATED RINGERS IV SOLN
INTRAVENOUS | Status: DC
  Administered 2014-11-29: 21:00:00 via INTRAVENOUS

## 2014-11-29 NOTE — Progress Notes (Signed)
Notified Dr. Ambrose MantleHenley of new admit- orders in by by Dr. Senaida Oresichardson. - MD aware. No new orders.

## 2014-11-29 NOTE — Progress Notes (Signed)
Spoke with Dr. Senaida Oresichardson- orders already placed- Additional orders given- lantus 20units SQ tonight- CBG q4 hours- Pitocin low dose if unable to  Place cytotec.  Stadol orders if pt needs.

## 2014-11-29 NOTE — H&P (Signed)
Bynum BellowsMonet Boydstun is a 28 y.o. female  G3P1011 at 38+ weeks ( EDD 12/08/14 by LMP c/w 10 week US) presenting for IOL given increasing BP to 150-160/100 c/w gestational hypertension.  No proteinuria and labs and NST's WNL.  Prenatal care complicated by Type 2 diabetes that was controlled on lantus and SSI both prior to and during pregnancy.  Lantus and humalog were increased throughout pregnancy and current dose is 58 units lantus and SSI with meals 14 units break/16 units lunch/18 units dinner.  She has been followed with NST's since 32 weeks that have been reactive.  Her EFW was 73%ile on 11/21/14.  The patient also has hypothyroidism stable on medications.  She is bipolar and stable off medications this pregnancy but has used lamictal in past with good result.  She had an increased OSBR on second trimester screen but US was WNL.   She is GBS positive and rubella non-immune  Maternal Medical History:  Contractions: Frequency: irregular.   Perceived severity is mild.    Fetal activity: Perceived fetal activity is normal.    Prenatal complications: PIH.   Prenatal Complications - Diabetes: type 2.    OB History    Gravida Para Term Preterm AB TAB SAB Ectopic Multiple Living   3 1 1  1     1     NSVD 7#11oz 2001 2011 EAB  Past Medical History  Diagnosis Date  . Diabetes mellitus without complication   . Thyroid disease   . Bipolar 1 disorder   . Headache   . Hypothyroidism   . Bipolar disorder   . GERD (gastroesophageal reflux disease)   . Arthritis    No past surgical history on file. Family History: family history is not on file. Social History:  reports that she has never smoked. She does not have any smokeless tobacco history on file. She reports that she uses illicit drugs (Marijuana). She reports that she does not drink alcohol.   Prenatal Transfer Tool  Maternal Diabetes: Yes:  Diabetes Type:  Pre-pregnancy Genetic Screening: Normal for trisomy risk, increased OSBR with normal  US Maternal Ultrasounds/Referrals: Normal Fetal Ultrasounds or other Referrals:  Fetal echo  WNL Maternal Substance Abuse:  No Significant Maternal Medications:  Meds include: Syntroid, lantus, humalog Significant Maternal Lab Results:  Lab values include: Group B Strep positive Other Comments:  None  ROS    Last menstrual period 03/03/2014. Maternal Exam:  Uterine Assessment: Contraction strength is mild.  Contraction frequency is irregular.   Abdomen: Patient reports no abdominal tenderness. Fetal presentation: vertex  Introitus: Normal vulva. Normal vagina.    Physical Exam  Constitutional: She appears well-developed and well-nourished.  Cardiovascular: Normal rate and regular rhythm.   Respiratory: Effort normal.  GI: Soft.  Genitourinary: Vagina normal.  Neurological: She is alert.  Psychiatric: She has a normal mood and affect.    Prenatal labs: ABO, Rh: A/Negative/-- (06/02 0000) Antibody: Negative (06/02 0000) Rubella: Nonimmune (06/02 0000) RPR: Nonreactive (06/02 0000)  HBsAg: Negative (06/02 0000)  HIV: Non-reactive (06/02 0000)  GBS: Positive (11/17 0000)  First trimester screen and AFP negative  Assessment/Plan: Pt admitted for ripening and IOL given gestational hypertension in a pregnancy complicated by Type 2 DM.  Will admit for cytotec or low dose pitocin if contracting too much for that.  Pt given 1/2 of her usual lantus dose to account for po clear status and will start glucomander for BS >200.  PCN for +GBS.    Oliver PilaICHARDSON,Yocelyn Brocious W 11/29/2014, 5:35  PM

## 2014-11-30 ENCOUNTER — Encounter (HOSPITAL_COMMUNITY)
Admission: RE | Disposition: A | Discharge status: Home / Self Care | Payer: Self-pay | Source: Ambulatory Visit | Attending: Obstetrics and Gynecology

## 2014-11-30 ENCOUNTER — Encounter (HOSPITAL_COMMUNITY): Payer: Self-pay

## 2014-11-30 ENCOUNTER — Inpatient Hospital Stay (HOSPITAL_COMMUNITY): Payer: Medicaid Other | Admitting: Anesthesiology

## 2014-11-30 LAB — CBC
HCT: 35.3 % — ABNORMAL LOW (ref 36.0–46.0)
Hemoglobin: 11.7 g/dL — ABNORMAL LOW (ref 12.0–15.0)
MCH: 25.9 pg — ABNORMAL LOW (ref 26.0–34.0)
MCHC: 33.1 g/dL (ref 30.0–36.0)
MCV: 78.3 fL (ref 78.0–100.0)
PLATELETS: 126 10*3/uL — AB (ref 150–400)
RBC: 4.51 MIL/uL (ref 3.87–5.11)
RDW: 17.2 % — AB (ref 11.5–15.5)
WBC: 9.2 10*3/uL (ref 4.0–10.5)

## 2014-11-30 LAB — ABO/RH: ABO/RH(D): A NEG

## 2014-11-30 LAB — GLUCOSE, CAPILLARY
GLUCOSE-CAPILLARY: 141 mg/dL — AB (ref 70–99)
Glucose-Capillary: 85 mg/dL (ref 70–99)
Glucose-Capillary: 80 mg/dL (ref 70–99)
Glucose-Capillary: 101 mg/dL — ABNORMAL HIGH (ref 70–99)
Glucose-Capillary: 113 mg/dL — ABNORMAL HIGH (ref 70–99)
Glucose-Capillary: 109 mg/dL — ABNORMAL HIGH (ref 70–99)

## 2014-11-30 LAB — TYPE AND SCREEN
ABO/RH(D): A NEG
Antibody Screen: NEGATIVE

## 2014-11-30 LAB — RPR

## 2014-11-30 SURGERY — Surgical Case
Anesthesia: Epidural

## 2014-11-30 MED ORDER — ONDANSETRON HCL 4 MG/2ML IJ SOLN
INTRAMUSCULAR | Status: AC
  Filled 2014-11-30: qty 2

## 2014-11-30 MED ORDER — OXYTOCIN 10 UNIT/ML IJ SOLN
INTRAMUSCULAR | Status: AC
  Filled 2014-11-30: qty 4

## 2014-11-30 MED ORDER — PHENYLEPHRINE 8 MG IN D5W 100 ML (0.08MG/ML) PREMIX OPTIME
INJECTION | INTRAVENOUS | Status: AC
  Filled 2014-11-30: qty 100

## 2014-11-30 MED ORDER — FENTANYL 2.5 MCG/ML BUPIVACAINE 1/10 % EPIDURAL INFUSION (WH - ANES)
INTRAMUSCULAR | Status: AC
  Filled 2014-11-30: qty 125

## 2014-11-30 MED ORDER — LACTATED RINGERS IV SOLN
500.0000 mL | Freq: Once | INTRAVENOUS | Status: AC
  Administered 2014-11-30: 500 mL via INTRAVENOUS

## 2014-11-30 MED ORDER — METFORMIN HCL 500 MG PO TABS
500.0000 mg | ORAL_TABLET | Freq: Every day | ORAL | Status: DC
  Administered 2014-11-30: 500 mg via ORAL
  Filled 2014-11-30: qty 1

## 2014-11-30 MED ORDER — PHENYLEPHRINE 40 MCG/ML (10ML) SYRINGE FOR IV PUSH (FOR BLOOD PRESSURE SUPPORT)
PREFILLED_SYRINGE | INTRAVENOUS | Status: AC
  Filled 2014-11-30: qty 10

## 2014-11-30 MED ORDER — FENTANYL 2.5 MCG/ML BUPIVACAINE 1/10 % EPIDURAL INFUSION (WH - ANES): 14.0000 mL/h | INTRAMUSCULAR | Status: DC | PRN

## 2014-11-30 MED ORDER — INSULIN ASPART 100 UNIT/ML ~~LOC~~ SOLN
0.0000 [IU] | Freq: Three times a day (TID) | SUBCUTANEOUS | Status: DC
  Administered 2014-12-01 (×2): 4 [IU] via SUBCUTANEOUS

## 2014-11-30 MED ORDER — BUPIVACAINE IN DEXTROSE 0.75-8.25 % IT SOLN
INTRATHECAL | Status: DC | PRN
  Administered 2014-11-30: 1.7 mL via INTRATHECAL

## 2014-11-30 MED ORDER — MEPERIDINE HCL 25 MG/ML IJ SOLN: 6.2500 mg | INTRAMUSCULAR | Status: DC | PRN

## 2014-11-30 MED ORDER — SCOPOLAMINE 1 MG/3DAYS TD PT72
1.0000 | MEDICATED_PATCH | Freq: Once | TRANSDERMAL | Status: AC
  Administered 2014-11-30: 1 via TRANSDERMAL

## 2014-11-30 MED ORDER — DIPHENHYDRAMINE HCL 50 MG/ML IJ SOLN: 12.5000 mg | INTRAMUSCULAR | Status: DC | PRN

## 2014-11-30 MED ORDER — PHENYLEPHRINE 8 MG IN D5W 100 ML (0.08MG/ML) PREMIX OPTIME
INJECTION | INTRAVENOUS | Status: DC | PRN
  Administered 2014-11-30: 60 ug/min via INTRAVENOUS

## 2014-11-30 MED ORDER — MORPHINE SULFATE 0.5 MG/ML IJ SOLN
INTRAMUSCULAR | Status: AC
  Filled 2014-11-30: qty 10

## 2014-11-30 MED ORDER — ONDANSETRON HCL 4 MG/2ML IJ SOLN
INTRAMUSCULAR | Status: DC | PRN
  Administered 2014-11-30: 4 mg via INTRAVENOUS

## 2014-11-30 MED ORDER — EPHEDRINE 5 MG/ML INJ: 10.0000 mg | INTRAVENOUS | Status: DC | PRN

## 2014-11-30 MED ORDER — MORPHINE SULFATE (PF) 0.5 MG/ML IJ SOLN
INTRAMUSCULAR | Status: DC | PRN
  Administered 2014-11-30: .15 mg via INTRATHECAL

## 2014-11-30 MED ORDER — PHENYLEPHRINE 40 MCG/ML (10ML) SYRINGE FOR IV PUSH (FOR BLOOD PRESSURE SUPPORT): 80.0000 ug | PREFILLED_SYRINGE | INTRAVENOUS | Status: DC | PRN

## 2014-11-30 MED ORDER — ZOLPIDEM TARTRATE 5 MG PO TABS
5.0000 mg | ORAL_TABLET | Freq: Every day | ORAL | Status: AC
  Administered 2014-11-30: 5 mg via ORAL
  Filled 2014-11-30: qty 1

## 2014-11-30 MED ORDER — LACTATED RINGERS IV SOLN
INTRAVENOUS | Status: DC | PRN
  Administered 2014-11-30 (×3): via INTRAVENOUS

## 2014-11-30 MED ORDER — CEFAZOLIN SODIUM-DEXTROSE 2-3 GM-% IV SOLR
INTRAVENOUS | Status: AC
  Filled 2014-11-30: qty 50

## 2014-11-30 MED ORDER — FENTANYL CITRATE 0.05 MG/ML IJ SOLN
25.0000 ug | INTRAMUSCULAR | Status: DC | PRN
  Administered 2014-11-30 (×3): 50 ug via INTRAVENOUS

## 2014-11-30 MED ORDER — SODIUM CHLORIDE 0.9 % IV SOLN
INTRAVENOUS | Status: AC
  Administered 2014-11-30 (×2): via INTRAVENOUS

## 2014-11-30 MED ORDER — DIAZEPAM 5 MG/ML IJ SOLN
5.0000 mg | INTRAMUSCULAR | Status: AC | PRN
  Administered 2014-11-30 (×2): 5 mg via INTRAVENOUS
  Filled 2014-11-30: qty 2

## 2014-11-30 MED ORDER — FENTANYL 2.5 MCG/ML BUPIVACAINE 1/10 % EPIDURAL INFUSION (WH - ANES)
14.0000 mL/h | INTRAMUSCULAR | Status: DC | PRN
  Administered 2014-11-30 (×2): 14 mL/h via EPIDURAL
  Filled 2014-11-30: qty 125

## 2014-11-30 MED ORDER — OXYTOCIN 10 UNIT/ML IJ SOLN
40.0000 [IU] | INTRAVENOUS | Status: DC | PRN
  Administered 2014-11-30: 40 [IU] via INTRAVENOUS

## 2014-11-30 MED ORDER — FENTANYL CITRATE 0.05 MG/ML IJ SOLN
INTRAMUSCULAR | Status: DC | PRN
  Administered 2014-11-30: 25 ug via INTRATHECAL

## 2014-11-30 MED ORDER — DEXTROSE 5 % IV SOLN
3.0000 g | Freq: Once | INTRAVENOUS | Status: AC
  Administered 2014-11-30: 3 g via INTRAVENOUS
  Filled 2014-11-30: qty 3000

## 2014-11-30 MED ORDER — INSULIN GLARGINE 100 UNIT/ML ~~LOC~~ SOLN
22.0000 [IU] | Freq: Every day | SUBCUTANEOUS | Status: DC
  Administered 2014-12-01 – 2014-12-02 (×3): 22 [IU] via SUBCUTANEOUS
  Filled 2014-11-30 (×2): qty 0.22

## 2014-11-30 MED ORDER — METHOCARBAMOL 750 MG PO TABS
750.0000 mg | ORAL_TABLET | Freq: Once | ORAL | Status: AC
  Administered 2014-11-30: 750 mg via ORAL
  Filled 2014-11-30: qty 1

## 2014-11-30 MED ORDER — SCOPOLAMINE 1 MG/3DAYS TD PT72
MEDICATED_PATCH | TRANSDERMAL | Status: AC
  Filled 2014-11-30: qty 1

## 2014-11-30 MED ORDER — LIDOCAINE HCL (PF) 1 % IJ SOLN
INTRAMUSCULAR | Status: DC | PRN
  Administered 2014-11-30: 4 mL
  Administered 2014-11-30: 6 mL

## 2014-11-30 MED ORDER — FENTANYL CITRATE 0.05 MG/ML IJ SOLN
INTRAMUSCULAR | Status: AC
  Filled 2014-11-30: qty 2

## 2014-11-30 MED ORDER — FENTANYL CITRATE 0.05 MG/ML IJ SOLN
INTRAMUSCULAR | Status: AC
  Administered 2014-11-30: 50 ug via INTRAVENOUS
  Filled 2014-11-30: qty 2

## 2014-11-30 MED ORDER — FENTANYL 2.5 MCG/ML BUPIVACAINE 1/10 % EPIDURAL INFUSION (WH - ANES)
INTRAMUSCULAR | Status: DC | PRN
  Administered 2014-11-30: 14 mL/h via EPIDURAL

## 2014-11-30 SURGICAL SUPPLY — 36 items
BENZOIN TINCTURE PRP APPL 2/3 (GAUZE/BANDAGES/DRESSINGS) ×3 IMPLANT
CLAMP CORD UMBIL (MISCELLANEOUS) IMPLANT
CLOSURE WOUND 1/2 X4 (GAUZE/BANDAGES/DRESSINGS) ×1
CLOTH BEACON ORANGE TIMEOUT ST (SAFETY) ×3 IMPLANT
DRAPE SHEET LG 3/4 BI-LAMINATE (DRAPES) IMPLANT
DRSG OPSITE POSTOP 4X10 (GAUZE/BANDAGES/DRESSINGS) ×3 IMPLANT
DURAPREP 26ML APPLICATOR (WOUND CARE) ×3 IMPLANT
ELECT REM PT RETURN 9FT ADLT (ELECTROSURGICAL) ×3
ELECTRODE REM PT RTRN 9FT ADLT (ELECTROSURGICAL) ×1 IMPLANT
EXTRACTOR VACUUM KIWI (MISCELLANEOUS) IMPLANT
GLOVE BIO SURGEON STRL SZ 6.5 (GLOVE) ×2 IMPLANT
GLOVE BIO SURGEONS STRL SZ 6.5 (GLOVE) ×1
GOWN STRL REUS W/TWL LRG LVL3 (GOWN DISPOSABLE) ×6 IMPLANT
KIT ABG SYR 3ML LUER SLIP (SYRINGE) IMPLANT
NEEDLE HYPO 25X5/8 SAFETYGLIDE (NEEDLE) IMPLANT
NS IRRIG 1000ML POUR BTL (IV SOLUTION) ×3 IMPLANT
PACK C SECTION WH (CUSTOM PROCEDURE TRAY) ×3 IMPLANT
PAD ABD 7.5X8 STRL (GAUZE/BANDAGES/DRESSINGS) ×3 IMPLANT
PAD OB MATERNITY 4.3X12.25 (PERSONAL CARE ITEMS) ×3 IMPLANT
RTRCTR C-SECT PINK 25CM LRG (MISCELLANEOUS) ×3 IMPLANT
STRIP CLOSURE SKIN 1/2X4 (GAUZE/BANDAGES/DRESSINGS) ×2 IMPLANT
SUT CHROMIC 1 CTX 36 (SUTURE) ×6 IMPLANT
SUT PLAIN 0 NONE (SUTURE) IMPLANT
SUT PLAIN 2 0 XLH (SUTURE) ×3 IMPLANT
SUT VIC AB 0 CT1 27 (SUTURE) ×4
SUT VIC AB 0 CT1 27XBRD ANBCTR (SUTURE) ×2 IMPLANT
SUT VIC AB 2-0 CT1 27 (SUTURE) ×4
SUT VIC AB 2-0 CT1 TAPERPNT 27 (SUTURE) ×2 IMPLANT
SUT VIC AB 3-0 CT1 27 (SUTURE)
SUT VIC AB 3-0 CT1 TAPERPNT 27 (SUTURE) IMPLANT
SUT VIC AB 3-0 SH 27 (SUTURE) ×2
SUT VIC AB 3-0 SH 27X BRD (SUTURE) ×1 IMPLANT
SUT VIC AB 4-0 KS 27 (SUTURE) ×3 IMPLANT
TOWEL OR 17X24 6PK STRL BLUE (TOWEL DISPOSABLE) ×3 IMPLANT
TRAY FOLEY CATH 14FR (SET/KITS/TRAYS/PACK) ×3 IMPLANT
WATER STERILE IRR 1000ML POUR (IV SOLUTION) ×3 IMPLANT

## 2014-11-30 NOTE — Op Note (Signed)
Operative note  Preoperative diagnosis Term pregnancy at 38-6/7 weeks Type 2 diabetes Gestational hypertension Arrest of dilation  Postoperative diagnosis Same  Procedure Primary low transverse C-section with 2 layer closure of uterus  Surgeon Dr. Huel CoteKathy Charlann [name]  Anesthesia Spinal/epidural  Findings There was a viable female infant in the OP vertex presentation.  Apgars were 8 and 9,  weight 8 lbs. 3 oz.  Uterus ovaries and tubes appeared normal.    Fluids Estimated blood loss 700 cc Urine output 1200 cc slightly blood-tinged urine IV fluids  2200 cc LR  Specimen Placenta sent to labor and delivery  Procedure note  Patient was taken to the operating room where her epidural was replaced with spinal anesthesia and was found to be adequate by Allis clamp test. She was prepped and draped in the normal sterile fashion in the dorsal supine position with a leftward tilt. An appropriate time out was performed. A Pfannenstiel skin incision was then made with the scalpel and carried through to the underlying layer of fascia by sharp dissection and Bovie cautery. The fascia was nicked in the midline and the incision was extended laterally with Mayo scissors. The inferior aspect of the incision was grasped Coker clamps and dissected off the underlying rectus muscles. In a similar fashion the superior aspect was dissected off the rectus muscles. Rectus muscles were separated in the midline and the peritoneal cavity entered bluntly. The peritoneal incision was then extended both superiorly and inferiorly with careful attention to avoid both bowel and bladder. The Alexis self-retaining wound retractor was then placed within the incision and the lower uterine segment exposed. The bladder flap was developed with Metzenbaum scissors and pushed away from the lower uterine segment. The lower uterine segment was then incised in a transverse fashion and the cavity itself entered bluntly. The incision was  extended bluntly. The infant's head was then lifted and delivered from the incision without difficulty. The remainder of the infant delivered and the nose and mouth bulb suctioned with the cord clamped and cut as well. The infant was handed off to the waiting pediatricians. The placenta was then spontaneously expressed from the uterus and the uterus cleared of all clots and debris with moist lap sponge. The uterine incision was then repaired in 2 layers the first layer was a running locked layer 1-0 chromic and the second an imbricating layer of the same suture. The tubes and ovaries were inspected and the gutters cleared of all clots and debris. The uterine incision was inspected and found to be hemostatic. All instruments and sponges as well as the Alexis retractor were then removed from the abdomen. The rectus muscles and peritoneum were then reapproximated with several interrupted mattress sutures of 2-0 Vicryl. The fascia was then closed with 0 Vicryl in a running fashion. Subcutaneous tissue was reapproximated with 3-0 plain in a running fashion. The skin was closed with a subcuticular stitch of 4-0 Vicryl on a Keith needle and then reinforced with benzoin and Steri-Strips. At the conclusion of the procedure all instruments and sponge counts were correct. Patient was taken to the recovery room in good condition with her baby accompanying her skin to skin.

## 2014-11-30 NOTE — Progress Notes (Signed)
Patient ID: Sierra BellowsMonet [name], female   DOB: 1985/12/24, 28 y.o.   MRN: [id number] Pt just received epidural and now comfortable  FHR category 1 Cervic 7/80/-2 AROM BBOW-clear IUPC placed to adjust pitocin. Continue to follow progress.

## 2014-11-30 NOTE — Progress Notes (Signed)
Patient ID: Bynum BellowsMonet Hicklin, female   DOB: 22-Jun-1986, 28 y.o.   MRN: [id number] Pt uncomfortable with contractions after receiving cytotec x 2 afeb vss--BP acceptable PIH labs WNL on admission Cervix 80/4-5/-3 with BBOW palpable, cannot tell how high head is behind bag  Pt declines epidural, felt with last pregnancy the epidural did not work Vertex not really able to be felt with BBOW so will give a bit more time before attempting AROM PCN on board BS have been 80's, feels ok Will follow progress, on low dose pitocin

## 2014-11-30 NOTE — Progress Notes (Signed)
Dr Jean RosenthalJackson in to assess epidural level and neck pain- Dr Senaida Oresichardson notified of pt's discomfort, plan of care discussed. Will try Valium for muscle relaxation and increase epidural continuous rate.  Pt understands and is in agreement. Pt frequently changing positions on her own.  Positioned with peanut ball on side, but pt c/o severe neck pain and turns herself to back or high fowler's.  Massage done with moderate relief.

## 2014-11-30 NOTE — Anesthesia Postprocedure Evaluation (Signed)
  Anesthesia Post-op Note  Patient: Sierra [name]  Procedure(s) Performed: Procedure(s): CESAREAN SECTION (N/A)  Patient Location: PACU  Anesthesia Type:Spinal  Level of Consciousness: awake, alert  and oriented  Airway and Oxygen Therapy: Patient Spontanous Breathing  Post-op Pain: none  Post-op Assessment: Post-op Vital signs reviewed, Patient's Cardiovascular Status Stable, Respiratory Function Stable, Patent Airway, No signs of Nausea or vomiting, Pain level controlled, No headache, No residual numbness and No residual motor weakness  Post-op Vital Signs: Reviewed and stable  Last Vitals:  Filed Vitals:   11/30/14 2230  BP: 135/84  Pulse: 85  Temp: 37.1 C  Resp: 26    Complications: No apparent anesthesia complications

## 2014-11-30 NOTE — Progress Notes (Signed)
Patient ID: Sierra BellowsMonet [name], female   DOB: 1986/05/02, 28 y.o.   MRN: [id number] Pt has made slow progress and no real descent of vertex She has had some significant upper back and shoulder pain that has prevented her from moving into different positions FHR category 1 Cervix 80/8/-2 ?OP  Pt with slower than expected progress.  Pitocin at 10 mu and has been increased with MVU about 180 D/w pt progress slow and descent has not occurred.  Will try to give a bit more time and see if progress can be made, if arrests at 8 cm d/w pt will need to proceed with c-section

## 2014-11-30 NOTE — Transfer of Care (Signed)
Immediate Anesthesia Transfer of Care Note  Patient: Sierra [name]  Procedure(s) Performed: Procedure(s): CESAREAN SECTION (N/A)  Patient Location: PACU  Anesthesia Type:Spinal  Level of Consciousness: awake, alert  and patient cooperative  Airway & Oxygen Therapy: Patient Spontanous Breathing  Post-op Assessment: Report given to PACU RN and Post -op Vital signs reviewed and stable  Post vital signs: Reviewed and stable  Complications: No apparent anesthesia complications

## 2014-11-30 NOTE — Anesthesia Preprocedure Evaluation (Addendum)
Anesthesia Evaluation  Patient identified by MRN, date of birth, ID band Patient awake    Reviewed: Allergy & Precautions, H&P , Patient's Chart, lab work & pertinent test results  Airway Mallampati: II  TM Distance: >3 FB Neck ROM: full    Dental  (+) Teeth Intact   Pulmonary  breath sounds clear to auscultation        Cardiovascular hypertension, On Medications Rhythm:regular Rate:Normal     Neuro/Psych    GI/Hepatic GERD-  ,  Endo/Other  diabetes, Type 2, Insulin DependentMorbid obesity  Renal/GU      Musculoskeletal   Abdominal   Peds  Hematology   Anesthesia Other Findings       Reproductive/Obstetrics (+) Pregnancy                            Anesthesia Physical Anesthesia Plan  ASA: III and emergent  Anesthesia Plan: Epidural and Combined Spinal and Epidural   Post-op Pain Management:    Induction:   Airway Management Planned: Natural Airway  Additional Equipment:   Intra-op Plan:   Post-operative Plan:   Informed Consent: I have reviewed the patients History and Physical, chart, labs and discussed the procedure including the risks, benefits and alternatives for the proposed anesthesia with the patient or authorized representative who has indicated his/her understanding and acceptance.   Dental Advisory Given  Plan Discussed with: Anesthesiologist, CRNA and Surgeon  Anesthesia Plan Comments: (Labs checked- platelets confirmed with RN in room. Fetal heart tracing, per RN, reported to be stable enough for sitting procedure. Discussed epidural, and patient consents to the procedure:  included risk of possible headache,backache, failed block, allergic reaction, and nerve injury. This patient was asked if she had any questions or concerns before the procedure started. Patient for urgent C/Section for failure to progress. Epidural has been questionable. Will do CSE in OR.)        Anesthesia Quick Evaluation

## 2014-11-30 NOTE — Progress Notes (Signed)
Patient ID: Sierra BellowsMonet [name], female   DOB: 12/07/1986, 28 y.o.   MRN: [id number] Pt miserable with increasing back pain and still with shoulder pain as well. States she is ready for c-section afeb vss BP 150-90 Cervix essentially unchanged  7-8/70/-2  Edematous FHR category 1 D/w pt arrest of dilation and c-section process in detail. We specifically discussed risks of bleeding, infection and possible damage to bowel and bladder. She is ready to proceed.  Will notify OR and anesthesia.  D/w Dr. Malen GauzeFoster she may need epidural replaced with  Spinal given discomfort.

## 2014-11-30 NOTE — Progress Notes (Signed)
Report given to  Dr. Senaida Oresichardson  From last night- Orders to start Pitocin at shift change- PCN now.

## 2014-11-30 NOTE — Progress Notes (Signed)
Unable to maintain continuous tracing d/t pt's activity and body habitus; Pitocin currently at 1 mu/min; stopped.  Waiting for CBS results for epidural placement.

## 2014-11-30 NOTE — Anesthesia Procedure Notes (Addendum)
Epidural Patient location during procedure: OB  Preanesthetic Checklist Completed: patient identified, site marked, surgical consent, pre-op evaluation, timeout performed, IV checked, risks and benefits discussed and monitors and equipment checked  Epidural Patient position: sitting Prep: site prepped and draped and DuraPrep Patient monitoring: continuous pulse ox and blood pressure Approach: midline Location: L3-L4 Injection technique: LOR air  Needle:  Needle type: Tuohy  Needle gauge: 17 G Needle length: 9 cm and 9 Needle insertion depth: 10 cm Catheter type: closed end flexible Catheter size: 19 Gauge Catheter at skin depth: 18 cm Test dose: negative  Assessment Events: blood not aspirated, injection not painful, no injection resistance, negative IV test and no paresthesia  Additional Notes 1st catheter needed to be replaced because the markings were not visible after catheter withdrawal; pulled out too far unintentionally. BBraun notified Replaced at same LOR without difficulty Dosing of Epidural:  1st dose, through catheter ............................................Marland Kitchen.  Xylocaine 40 mg  2nd dose, through catheter, after waiting 3 minutes........Marland Kitchen.Xylocaine 60 mg    ( 1% Xylo charted as a single dose in Epic Meds for ease of charting; actual dosing was fractionated as above, for saftey's sake)  As each dose occurred, patient was free of IV sx; and patient exhibited no evidence of SA injection.  Patient is more comfortable after epidural dosed. Please see RN's note for documentation of vital signs,and FHR which are stable.  Patient reminded not to try to ambulate with numb legs, and that an RN must be present when she attempts to get up.      Spinal Patient location during procedure: OR Start time: 11/30/2014 8:03 PM Staffing Anesthesiologist: Jarron Curley A. Performed by: anesthesiologist  Preanesthetic Checklist Completed: patient identified, site  marked, surgical consent, pre-op evaluation, timeout performed, IV checked, risks and benefits discussed and monitors and equipment checked Spinal Block Patient position: sitting Prep: site prepped and draped and DuraPrep Patient monitoring: cardiac monitor, continuous pulse ox, blood pressure and heart rate Approach: midline Location: L3-4 Injection technique: catheter Needle Needle type: Tuohy and Sprotte  Needle gauge: 24 G Needle length: 12.7 cm Needle insertion depth: 10 cm Catheter type: closed end flexible Catheter size: 19 g Catheter at skin depth: 15 cm Assessment Sensory level: T2 Additional Notes Patient tolerated procedure well. Adequate sensory level. Epidural performed with 17Ga Touhy. Spinal performed through epidural needle. CSF clear, free flow, no heme, no paresthesia. Epidural catheter threaded 5 cm into epidural space. Sterile dressing applied.

## 2014-11-30 NOTE — Progress Notes (Signed)
Called Dr. Ambrose MantleHenley- told of UC pattern- FHR and pt wanting a ambien.  Orders for Ambien 5mg  po now-  Place next cytotec when UC's space out

## 2014-12-01 ENCOUNTER — Encounter (HOSPITAL_COMMUNITY): Payer: Self-pay

## 2014-12-01 DIAGNOSIS — Z98891 History of uterine scar from previous surgery: Secondary | ICD-10-CM

## 2014-12-01 LAB — CBC
HCT: 30 % — ABNORMAL LOW (ref 36.0–46.0)
Hemoglobin: 10.1 g/dL — ABNORMAL LOW (ref 12.0–15.0)
MCH: 26.4 pg (ref 26.0–34.0)
MCHC: 33.7 g/dL (ref 30.0–36.0)
MCV: 78.3 fL (ref 78.0–100.0)
Platelets: 123 10*3/uL — ABNORMAL LOW (ref 150–400)
RBC: 3.83 MIL/uL — ABNORMAL LOW (ref 3.87–5.11)
RDW: 17.1 % — AB (ref 11.5–15.5)
WBC: 12.1 10*3/uL — AB (ref 4.0–10.5)

## 2014-12-01 LAB — GLUCOSE, CAPILLARY
GLUCOSE-CAPILLARY: 122 mg/dL — AB (ref 70–99)
GLUCOSE-CAPILLARY: 141 mg/dL — AB (ref 70–99)
Glucose-Capillary: 173 mg/dL — ABNORMAL HIGH (ref 70–99)
Glucose-Capillary: 151 mg/dL — ABNORMAL HIGH (ref 70–99)
Glucose-Capillary: 123 mg/dL — ABNORMAL HIGH (ref 70–99)

## 2014-12-01 MED ORDER — PRENATAL MULTIVITAMIN CH
1.0000 | ORAL_TABLET | Freq: Every day | ORAL | Status: DC
  Administered 2014-12-01 – 2014-12-03 (×3): 1 via ORAL
  Filled 2014-12-01 (×3): qty 1

## 2014-12-01 MED ORDER — SIMETHICONE 80 MG PO CHEW: 80.0000 mg | CHEWABLE_TABLET | ORAL | Status: DC | PRN

## 2014-12-01 MED ORDER — DIPHENHYDRAMINE HCL 25 MG PO CAPS: 25.0000 mg | ORAL_CAPSULE | Freq: Four times a day (QID) | ORAL | Status: DC | PRN

## 2014-12-01 MED ORDER — NALBUPHINE HCL 10 MG/ML IJ SOLN: 5.0000 mg | Freq: Once | INTRAMUSCULAR | Status: AC | PRN

## 2014-12-01 MED ORDER — DIBUCAINE 1 % RE OINT
1.0000 | TOPICAL_OINTMENT | RECTAL | Status: DC | PRN
Start: 2014-12-01 — End: 2014-12-03

## 2014-12-01 MED ORDER — METFORMIN HCL ER 500 MG PO TB24
500.0000 mg | ORAL_TABLET | Freq: Every day | ORAL | Status: DC
  Administered 2014-12-01 – 2014-12-02 (×2): 500 mg via ORAL
  Filled 2014-12-01 (×2): qty 1

## 2014-12-01 MED ORDER — IBUPROFEN 600 MG PO TABS
600.0000 mg | ORAL_TABLET | Freq: Four times a day (QID) | ORAL | Status: DC
  Administered 2014-12-01 – 2014-12-03 (×10): 600 mg via ORAL
  Filled 2014-12-01 (×10): qty 1

## 2014-12-01 MED ORDER — ZOLPIDEM TARTRATE 5 MG PO TABS: 5.0000 mg | ORAL_TABLET | Freq: Every evening | ORAL | Status: DC | PRN

## 2014-12-01 MED ORDER — SENNOSIDES-DOCUSATE SODIUM 8.6-50 MG PO TABS
2.0000 | ORAL_TABLET | ORAL | Status: DC
  Administered 2014-12-01 – 2014-12-02 (×2): 2 via ORAL
  Filled 2014-12-01 (×2): qty 2

## 2014-12-01 MED ORDER — LACTATED RINGERS IV SOLN
INTRAVENOUS | Status: DC
  Administered 2014-12-01 (×2): via INTRAVENOUS

## 2014-12-01 MED ORDER — OXYCODONE-ACETAMINOPHEN 5-325 MG PO TABS
1.0000 | ORAL_TABLET | ORAL | Status: DC | PRN
  Administered 2014-12-02 – 2014-12-03 (×5): 1 via ORAL
  Filled 2014-12-01 (×5): qty 1

## 2014-12-01 MED ORDER — NALBUPHINE HCL 10 MG/ML IJ SOLN: 5.0000 mg | INTRAMUSCULAR | Status: DC | PRN

## 2014-12-01 MED ORDER — SODIUM CHLORIDE 0.9 % IJ SOLN
3.0000 mL | INTRAMUSCULAR | Status: DC | PRN
  Administered 2014-12-01: 3 mL via INTRAVENOUS
  Filled 2014-12-01: qty 3

## 2014-12-01 MED ORDER — LANOLIN HYDROUS EX OINT: 1.0000 "application " | TOPICAL_OINTMENT | CUTANEOUS | Status: DC | PRN

## 2014-12-01 MED ORDER — INSULIN ASPART 100 UNIT/ML ~~LOC~~ SOLN
0.0000 [IU] | Freq: Three times a day (TID) | SUBCUTANEOUS | Status: DC
  Administered 2014-12-01 – 2014-12-03 (×4): 2 [IU] via SUBCUTANEOUS

## 2014-12-01 MED ORDER — RHO D IMMUNE GLOBULIN 1500 UNIT/2ML IJ SOSY
300.0000 ug | PREFILLED_SYRINGE | Freq: Once | INTRAMUSCULAR | Status: AC
  Administered 2014-12-01: 300 ug via INTRAVENOUS
  Filled 2014-12-01: qty 2

## 2014-12-01 MED ORDER — LEVOTHYROXINE SODIUM 100 MCG PO TABS
200.0000 ug | ORAL_TABLET | Freq: Every day | ORAL | Status: DC
  Filled 2014-12-01: qty 2

## 2014-12-01 MED ORDER — SIMETHICONE 80 MG PO CHEW
80.0000 mg | CHEWABLE_TABLET | Freq: Three times a day (TID) | ORAL | Status: DC
  Administered 2014-12-01 – 2014-12-03 (×7): 80 mg via ORAL
  Filled 2014-12-01 (×8): qty 1

## 2014-12-01 MED ORDER — INSULIN GLARGINE 100 UNIT/ML ~~LOC~~ SOLN
22.0000 [IU] | Freq: Every day | SUBCUTANEOUS | Status: DC
  Filled 2014-12-01: qty 0.22

## 2014-12-01 MED ORDER — TETANUS-DIPHTH-ACELL PERTUSSIS 5-2.5-18.5 LF-MCG/0.5 IM SUSP: 0.5000 mL | Freq: Once | INTRAMUSCULAR | Status: DC

## 2014-12-01 MED ORDER — ONDANSETRON HCL 4 MG/2ML IJ SOLN: 4.0000 mg | Freq: Three times a day (TID) | INTRAMUSCULAR | Status: DC | PRN

## 2014-12-01 MED ORDER — DIPHENHYDRAMINE HCL 25 MG PO CAPS: 25.0000 mg | ORAL_CAPSULE | ORAL | Status: DC | PRN

## 2014-12-01 MED ORDER — SIMETHICONE 80 MG PO CHEW
80.0000 mg | CHEWABLE_TABLET | ORAL | Status: DC
  Administered 2014-12-01 – 2014-12-02 (×2): 80 mg via ORAL
  Filled 2014-12-01 (×2): qty 1

## 2014-12-01 MED ORDER — OXYCODONE-ACETAMINOPHEN 5-325 MG PO TABS
2.0000 | ORAL_TABLET | ORAL | Status: DC | PRN
  Administered 2014-12-02 – 2014-12-03 (×3): 2 via ORAL
  Filled 2014-12-01 (×3): qty 2

## 2014-12-01 MED ORDER — NALOXONE HCL 0.4 MG/ML IJ SOLN: 0.4000 mg | INTRAMUSCULAR | Status: DC | PRN

## 2014-12-01 MED ORDER — LEVOTHYROXINE SODIUM 200 MCG PO TABS
200.0000 ug | ORAL_TABLET | Freq: Every day | ORAL | Status: DC
  Administered 2014-12-01 – 2014-12-03 (×3): 200 ug via ORAL
  Filled 2014-12-01 (×3): qty 1

## 2014-12-01 MED ORDER — DIPHENHYDRAMINE HCL 50 MG/ML IJ SOLN: 12.5000 mg | INTRAMUSCULAR | Status: DC | PRN

## 2014-12-01 MED ORDER — OXYTOCIN 40 UNITS IN LACTATED RINGERS INFUSION - SIMPLE MED: 62.5000 mL/h | INTRAVENOUS | Status: AC

## 2014-12-01 MED ORDER — ONDANSETRON HCL 4 MG PO TABS: 4.0000 mg | ORAL_TABLET | ORAL | Status: DC | PRN

## 2014-12-01 MED ORDER — NALOXONE HCL 1 MG/ML IJ SOLN
1.0000 ug/kg/h | INTRAVENOUS | Status: DC | PRN
  Filled 2014-12-01: qty 2

## 2014-12-01 MED ORDER — INSULIN ASPART 100 UNIT/ML ~~LOC~~ SOLN
8.0000 [IU] | Freq: Three times a day (TID) | SUBCUTANEOUS | Status: DC
  Administered 2014-12-01 – 2014-12-03 (×6): 8 [IU] via SUBCUTANEOUS

## 2014-12-01 MED ORDER — DEXTROSE 5 % IV SOLN
3.0000 g | Freq: Once | INTRAVENOUS | Status: AC
  Administered 2014-12-01: 3 g via INTRAVENOUS
  Filled 2014-12-01: qty 3000

## 2014-12-01 MED ORDER — WITCH HAZEL-GLYCERIN EX PADS: 1.0000 "application " | MEDICATED_PAD | CUTANEOUS | Status: DC | PRN

## 2014-12-01 MED ORDER — ONDANSETRON HCL 4 MG/2ML IJ SOLN: 4.0000 mg | INTRAMUSCULAR | Status: DC | PRN

## 2014-12-01 MED ORDER — MENTHOL 3 MG MT LOZG: 1.0000 | LOZENGE | OROMUCOSAL | Status: DC | PRN

## 2014-12-01 NOTE — Lactation Note (Signed)
This note was copied from the chart of Sierra [name]. Lactation Consultation Note  Patient Name: Sierra [name] [id number]'XToday's Date: 12/01/2014 Reason for consult: Initial assessment (pe rmom last fed t 1300 and has fed 4 times since birth , mom to page with feeding cues )  Baby has been to the breast  4 times , and mom reports swallows with feedings. Also per mom able to hand express easier than using hand pump. LC stressed the importance os skin to skin , and prior to latch breast massage, hand express, latch.  LC recommended to mom to call for feeding assessment with feeding cues. By 3-3 1/2 hours if baby isn't showing feeding cues, check diaper and skin to skin. Mother informed of post-discharge support and given phone number to the lactation department, including services for phone call assistance; out-patient appointments; and breastfeeding support group. List of other breastfeeding resources in the community given in the handout. Encouraged mother to call for problems or concerns related to breastfeeding.    Maternal Data Does the patient have breastfeeding experience prior to this delivery?: Yes  Feeding    LATCH Score/Interventions                      Lactation Tools Discussed/Used Tools: Pump (MBU RN had given mom a hand pump ) Breast pump type: Manual   Consult Status Consult Status: Follow-up Date: 12/01/14 Follow-up type: In-patient    Kathrin Greathouseorio, Nakira Litzau Ann 12/01/2014, 3:05 PM

## 2014-12-01 NOTE — Discharge Summary (Signed)
Obstetric Discharge Summary Reason for Admission: induction of labor Prenatal Procedures: NST Intrapartum Procedures: cesarean: low cervical, transverse Postpartum Procedures: antibiotics Complications-Operative and Postpartum: none HEMOGLOBIN  Date Value Ref Range Status  12/01/2014 10.1* 12.0 - 15.0 g/dL Final   HCT  Date Value Ref Range Status  12/01/2014 30.0* 36.0 - 46.0 % Final    Physical Exam:  General: alert and cooperative Lochia: appropriate Uterine Fundus: firm Incision: C/D/I   Discharge Diagnoses: Term Pregnancy-delivered                                         LTCS with 2 layer closure of uterus--Arrest of labor                                        Type 2 DM Discharge Information: Date: 12/03/2014 Activity: pelvic rest Diet: routine Medications: Ibuprofen and Percocet, Lantus 22 units q hs and novalog 8 units with meals, synthroid, metformin Condition: improved Instructions: refer to practice specific booklet Discharge to: home Follow-up Information    Follow up with Oliver PilaICHARDSON,Trystin Terhune W, MD In 2 weeks.   Specialty:  Obstetrics and Gynecology   Why:  incision check   Contact information:   [address] [place] [id number] [phone]       Newborn Data: Live born female  Birth Weight: 8 lb 2.7 oz (3705 g) APGAR: 9, 9  Home with mother.  Oliver PilaRICHARDSON,Mickenzie Stolar W 12/03/2014, 8:37 AM

## 2014-12-01 NOTE — Progress Notes (Signed)
Subjective: Postpartum Day 1: Cesarean Delivery Patient reports tolerating PO.  She has felt hot and room does feel warm.  No nausea, pain controlled  Objective: Vital signs in last 24 hours: Temp:  [97.9 F (36.6 C)-100.3 F (37.9 C)] 100 F (37.8 C) (12/12 0600) Pulse Rate:  [76-112] 101 (12/12 0600) Resp:  [18-34] 20 (12/12 0600) BP: (103-190)/(51-116) 114/66 mmHg (12/12 0600) SpO2:  [97 %-100 %] 100 % (12/12 0328)  Physical Exam:  General: alert and cooperative Lochia: appropriate Uterine Fundus: firm Incision: C/D/I BS  122-173   Recent Labs  11/30/14 1105 12/01/14 0606  HGB 11.7* 10.1*  HCT 35.3* 30.0*    Assessment/Plan: Status post Cesarean section.  Low grade temp to 100.3 so will repeat ancef x 1 more dose, may be from overheating Restarted metformin last PM, will restart lantus this PM as well as eating ok now.  Give sliding scale coverage of meals today.  Oliver PilaRICHARDSON,Jerrik Housholder W 12/01/2014, 9:46 AM

## 2014-12-01 NOTE — Addendum Note (Signed)
Addendum  created 12/01/14 1331 by Lincoln BrighamAngela Draughon Nikola Blackston, CRNA   Modules edited: Notes Section   Notes Section:  File: [id number]

## 2014-12-01 NOTE — Anesthesia Postprocedure Evaluation (Signed)
Anesthesia Post Note  Patient: Sierra [name]  Procedure(s) Performed: Procedure(s) (LRB): CESAREAN SECTION (N/A)  Anesthesia type: Epidural  Patient location: Mother/Baby  Post pain: Pain level controlled  Post assessment: Post-op Vital signs reviewed  Last Vitals:  Filed Vitals:   12/01/14 1236  BP: 100/57  Pulse: 89  Temp: 37.1 C  Resp: 18    Post vital signs: Reviewed  Level of consciousness:alert  Complications: No apparent anesthesia complications

## 2014-12-02 LAB — RH IG WORKUP (INCLUDES ABO/RH)
ABO/RH(D): A NEG
Fetal Screen: NEGATIVE
Gestational Age(Wks): 39
Unit division: 0

## 2014-12-02 LAB — GLUCOSE, CAPILLARY
GLUCOSE-CAPILLARY: 86 mg/dL (ref 70–99)
Glucose-Capillary: 109 mg/dL — ABNORMAL HIGH (ref 70–99)
Glucose-Capillary: 132 mg/dL — ABNORMAL HIGH (ref 70–99)
Glucose-Capillary: 113 mg/dL — ABNORMAL HIGH (ref 70–99)

## 2014-12-02 NOTE — Progress Notes (Addendum)
Patient states she does not have a doctor managing her diabetes, she would like to be referred to one. She is new to the area. .  Night shift  RN instructed patient on  how to use the patient's  education network.  Night shift Rn will report to day shift Rn patient's concerns.. Also Night shift RN will leave a sticky note for pt's Dr to address this issue  in the am. [facility] but [facility] was "off call". Pt states she does not know how to count carbs, etc.

## 2014-12-02 NOTE — Progress Notes (Addendum)
Subjective: Postpartum Day 2: Cesarean Delivery Patient reports incisional pain, tolerating PO and no problems voiding.    Objective: Vital signs in last 24 hours: Temp:  [97.3 F (36.3 C)-98.7 F (37.1 C)] 98.4 F (36.9 C) (12/13 0642) Pulse Rate:  [77-98] 77 (12/13 0642) Resp:  [18-20] 18 (12/13 0642) BP: (100-116)/(48-61) 114/58 mmHg (12/13 0642) SpO2:  [97 %-100 %] 100 % (12/12 2245)  Physical Exam:  General: alert and cooperative Lochia: appropriate Uterine Fundus: firm Incision: C/D/I  BS in good range, restarted lantus last night and will cover meals with SSI  Recent Labs  11/30/14 1105 12/01/14 0606  HGB 11.7* 10.1*  HCT 35.3* 30.0*    Assessment/Plan: Status post Cesarean section. Doing well postoperatively.  Continue current care. Baby to have echo tomorrow for murmur, but doing fine  Sierra [name] W 12/02/2014, 10:06 AM

## 2014-12-02 NOTE — Progress Notes (Signed)
Patient requested she take her Metformin at bedtime, her usual time. Changed administration time to 2200. Sierra SonNancy Leeanne Butters,RN

## 2014-12-02 NOTE — Lactation Note (Signed)
This note was copied from the chart of Sierra [name]. Lactation Consultation Note  Patient Name: Sierra [name]: 12/02/2014 Reason for consult: Follow-up assessment;Other (Comment) (updated doc flow sheets )  Per mom breast feeding is going well and baby just ate and was cluster feeding. Baby presently resting in moms arm, alert and content. LC encouraged to call for feeding latch score.    Maternal Data    Feeding Feeding Type:  (per mom recently breast feeding ) Length of feed: 30 min (per mom )  LATCH Score/Interventions                      Lactation Tools Discussed/Used     Consult Status Consult Status: Follow-up [name]: 12/03/14 Follow-up type: In-patient    Kathrin Greathouseorio, Katelen Luepke Ann 12/02/2014, 3:31 PM

## 2014-12-03 ENCOUNTER — Encounter (HOSPITAL_COMMUNITY): Payer: Self-pay | Admitting: Obstetrics and Gynecology

## 2014-12-03 LAB — GLUCOSE, CAPILLARY
GLUCOSE-CAPILLARY: 137 mg/dL — AB (ref 70–99)
Glucose-Capillary: 122 mg/dL — ABNORMAL HIGH (ref 70–99)

## 2014-12-03 MED ORDER — IBUPROFEN 600 MG PO TABS: 600.0000 mg | ORAL_TABLET | Freq: Four times a day (QID) | ORAL | Status: DC

## 2014-12-03 MED ORDER — INSULIN GLARGINE 100 UNIT/ML ~~LOC~~ SOLN: 22.0000 [IU] | Freq: Every day | SUBCUTANEOUS | Status: AC

## 2014-12-03 MED ORDER — INSULIN ASPART 100 UNIT/ML ~~LOC~~ SOLN: 8.0000 [IU] | Freq: Three times a day (TID) | SUBCUTANEOUS | Status: AC

## 2014-12-03 MED ORDER — OXYCODONE-ACETAMINOPHEN 5-325 MG PO TABS: 1.0000 | ORAL_TABLET | ORAL | Status: DC | PRN

## 2014-12-03 NOTE — Progress Notes (Signed)
Ur chart review completed.  

## 2014-12-03 NOTE — Plan of Care (Signed)
Problem: Discharge Progression Outcomes Goal: MMR given as ordered Outcome: Not Met (add Reason) Pt declines, info sheet given

## 2014-12-03 NOTE — Lactation Note (Signed)
This note was copied from the chart of Sierra M[place] Bow. Lactation Consultation Note       Follow up consult with this mom and baby, now 559 hours old. i observed mom latching her baby  With deep latch and audible swallows. Breast care reviewed. Mom knows to call for questions/concerns after discharge to home today.  Patient Name: Sierra [name] Reason for consult: Follow-up assessment   Maternal Data    Feeding Feeding Type: Breast Fed  LATCH Score/Interventions Latch: Grasps breast easily, tongue down, lips flanged, rhythmical sucking.  Audible Swallowing: Spontaneous and intermittent  Type of Nipple: Everted at rest and after stimulation  Comfort (Breast/Nipple): Filling, red/small blisters or bruises, mild/mod discomfort  Problem noted: Filling Interventions (Filling): Hand pump  Hold (Positioning): No assistance needed to correctly position infant at breast.  LATCH Score: 9  Lactation Tools Discussed/Used     Consult Status Consult Status: Complete Follow-up type: Call as needed    Alfred LevinsLee, Jonathan Kirkendoll Anne [name], 8:13 AM

## 2014-12-03 NOTE — Progress Notes (Signed)
Subjective: Postpartum Day 3  Cesarean Delivery Patient reports tolerating PO and no problems voiding.    Objective: Vital signs in last 24 hours: Temp:  [98.4 F (36.9 C)-98.9 F (37.2 C)] 98.9 F (37.2 C) (12/13 1730) Pulse Rate:  [77-99] 99 (12/13 1730) Resp:  [18] 18 (12/13 1730) BP: (114-135)/(55-58) 135/55 mmHg (12/13 1730)  Physical Exam:  General: alert and cooperative Lochia: appropriate Uterine Fundus: firm Incision: C/D/I BS all WNL   Recent Labs  11/30/14 1105 12/01/14 0606  HGB 11.7* 10.1*  HCT 35.3* 30.0*    Assessment/Plan: Status post Cesarean section. Doing well postoperatively.  Discharge home with standard precautions and return to office in 2 weeks for incision check.  Oliver PilaRICHARDSON,Ruth Kovich W 12/03/2014, 5:49 AM

## 2014-12-06 ENCOUNTER — Inpatient Hospital Stay (HOSPITAL_COMMUNITY): Admission: RE | Admit: 2014-12-06 | Payer: Medicaid - Out of State | Source: Ambulatory Visit

## 2014-12-12 ENCOUNTER — Encounter (HOSPITAL_COMMUNITY): Payer: Self-pay

## 2014-12-29 ENCOUNTER — Emergency Department (HOSPITAL_COMMUNITY): Payer: Medicaid Other

## 2014-12-29 ENCOUNTER — Emergency Department (HOSPITAL_COMMUNITY)
Admission: EM | Admit: 2014-12-29 | Discharge: 2014-12-29 | Disposition: A | Discharge status: Home / Self Care | Payer: Medicaid Other | Attending: Emergency Medicine | Admitting: Emergency Medicine

## 2014-12-29 ENCOUNTER — Encounter (HOSPITAL_COMMUNITY): Payer: Self-pay

## 2014-12-29 DIAGNOSIS — Z791 Long term (current) use of non-steroidal anti-inflammatories (NSAID): Secondary | ICD-10-CM | POA: Insufficient documentation

## 2014-12-29 DIAGNOSIS — R0789 Other chest pain: Secondary | ICD-10-CM | POA: Diagnosis not present

## 2014-12-29 DIAGNOSIS — E669 Obesity, unspecified: Secondary | ICD-10-CM | POA: Insufficient documentation

## 2014-12-29 DIAGNOSIS — M1990 Unspecified osteoarthritis, unspecified site: Secondary | ICD-10-CM | POA: Diagnosis not present

## 2014-12-29 DIAGNOSIS — Z8659 Personal history of other mental and behavioral disorders: Secondary | ICD-10-CM | POA: Insufficient documentation

## 2014-12-29 DIAGNOSIS — Z79899 Other long term (current) drug therapy: Secondary | ICD-10-CM | POA: Insufficient documentation

## 2014-12-29 DIAGNOSIS — Z794 Long term (current) use of insulin: Secondary | ICD-10-CM | POA: Insufficient documentation

## 2014-12-29 DIAGNOSIS — E119 Type 2 diabetes mellitus without complications: Secondary | ICD-10-CM | POA: Insufficient documentation

## 2014-12-29 DIAGNOSIS — R079 Chest pain, unspecified: Secondary | ICD-10-CM

## 2014-12-29 DIAGNOSIS — Z8719 Personal history of other diseases of the digestive system: Secondary | ICD-10-CM | POA: Insufficient documentation

## 2014-12-29 DIAGNOSIS — E079 Disorder of thyroid, unspecified: Secondary | ICD-10-CM | POA: Diagnosis not present

## 2014-12-29 LAB — I-STAT TROPONIN, ED: TROPONIN I, POC: 0 ng/mL (ref 0.00–0.08)

## 2014-12-29 LAB — CBC
HCT: 37.3 % (ref 36.0–46.0)
Hemoglobin: 11.9 g/dL — ABNORMAL LOW (ref 12.0–15.0)
MCH: 24.7 pg — AB (ref 26.0–34.0)
MCHC: 31.9 g/dL (ref 30.0–36.0)
MCV: 77.4 fL — ABNORMAL LOW (ref 78.0–100.0)
Platelets: 165 10*3/uL (ref 150–400)
RBC: 4.82 MIL/uL (ref 3.87–5.11)
RDW: 15.7 % — AB (ref 11.5–15.5)
WBC: 5.8 10*3/uL (ref 4.0–10.5)

## 2014-12-29 LAB — I-STAT CHEM 8, ED
BUN: 18 mg/dL (ref 6–23)
CALCIUM ION: 1.13 mmol/L (ref 1.12–1.23)
CHLORIDE: 105 meq/L (ref 96–112)
Creatinine, Ser: 0.7 mg/dL (ref 0.50–1.10)
GLUCOSE: 118 mg/dL — AB (ref 70–99)
HCT: 41 % (ref 36.0–46.0)
HEMOGLOBIN: 13.9 g/dL (ref 12.0–15.0)
Potassium: 4.3 mmol/L (ref 3.5–5.1)
Sodium: 140 mmol/L (ref 135–145)
TCO2: 23 mmol/L (ref 0–100)

## 2014-12-29 NOTE — ED Notes (Signed)
Pt. Had a c-section on December 11th.  Pt. Was diagnosed with gestational hypertenstion. No meds prescribed.   She developed chest tightness 2-3 days ago. Sharp pain radiates to her back.  Movement increases the pain,  Nothing helps it.  Intermittent lasting approximately 30 minutes at a time,  Pt. Becomes nauseated with the pain.  Skin is warm and dry.

## 2014-12-29 NOTE — ED Notes (Signed)
D/C I/v dry and intact Patient is being D/C

## 2014-12-29 NOTE — Discharge Instructions (Signed)

## 2014-12-29 NOTE — ED Provider Notes (Signed)
CSN: [id number]     Arrival date & time 12/29/14  4098 History   First MD Initiated Contact with Patient 12/29/14 [phone]     Chief Complaint  Patient presents with  . Chest Pain   Patient is a 29 y.o. female presenting with chest pain. The history is provided by the patient.  Chest Pain Chest pain location: Last saturday it was in the back and then it moved to the front. Pain quality: pressure and sharp   Pain radiates to:  Does not radiate Pain severity:  Severe Onset quality:  Sudden Duration:  15 minutes Timing:  Intermittent (Intermittent episodes of 10-15 min on saturday.  then an episode of 30 min on thursday.  She called her doctor yesterday and was told she needed to be checked for blood clots.  No pain since thursday.) Progression:  Resolved Chronicity:  New Context: at rest   Relieved by:  Nothing Ineffective treatments:  None tried Associated symptoms: no abdominal pain, no fever, no lower extremity edema, no nausea, no shortness of breath and not vomiting   Risk factors: diabetes mellitus, hypertension and obesity   Risk factors: no birth control, no coronary artery disease, no prior DVT/PE and no smoking  Pregnant now: Delivered a baby on dec 11th.     Past Medical History  Diagnosis Date  . Diabetes mellitus without complication   . Thyroid disease   . Bipolar 1 disorder   . Headache   . Hypothyroidism   . Bipolar disorder   . GERD (gastroesophageal reflux disease)   . Arthritis    Past Surgical History  Procedure Laterality Date  . Cesarean section N/A 11/30/2014    Procedure: CESAREAN SECTION;  Surgeon: Oliver Pila, MD;  Location: [facility];  Service: Obstetrics;  Laterality: N/A;   No family history on file. History  Substance Use Topics  . Smoking status: Never Smoker   . Smokeless tobacco: Not on file  . Alcohol Use: No   OB History    Gravida Para Term Preterm AB TAB SAB Ectopic Multiple Living   0 2     Review of Systems   Constitutional: Negative for fever.  Respiratory: Negative for shortness of breath.   Cardiovascular: Positive for chest pain.  Gastrointestinal: Negative for nausea, vomiting and abdominal pain.  All other systems reviewed and are negative.     Allergies  Flonase and Naproxen  Home Medications   Prior to Admission medications   Medication Sig Start Date End Date Taking? Authorizing Provider  ibuprofen (ADVIL,MOTRIN) 600 MG tablet Take 1 tablet (600 mg total) by mouth every 6 (six) hours. 12/03/14   Oliver Pila, MD  insulin aspart (NOVOLOG) 100 UNIT/ML injection Inject 8 Units into the skin 3 (three) times daily with meals. 12/03/14   Oliver Pila, MD  insulin glargine (LANTUS) 100 UNIT/ML injection Inject 0.22 mLs (22 Units total) into the skin at bedtime. 12/03/14   Oliver Pila, MD  levothyroxine (SYNTHROID, LEVOTHROID) 200 MCG tablet Take 200 mcg by mouth daily before breakfast.     Historical Provider, MD  metFORMIN (GLUCOPHAGE) 500 MG tablet Take 500 mg by mouth daily.    Historical Provider, MD  oxyCODONE-acetaminophen (PERCOCET/ROXICET) 5-325 MG per tablet Take 1 tablet by mouth every 4 (four) hours as needed (for pain scale less than 7). 12/03/14   Oliver Pila, MD  Prenatal Vit-Fe Fumarate-FA (PRENATAL MULTIVITAMIN) TABS tablet Take 1 tablet by  mouth daily at 12 noon.    Historical Provider, MD   BP 165/105 mmHg  Pulse 71  Temp(Src) 98 F (36.7 C) (Oral)  Resp 16  SpO2 100% Physical Exam  Constitutional: She appears well-developed and well-nourished. No distress.  Obese   HENT:  Head: Normocephalic and atraumatic.  Right Ear: External ear normal.  Left Ear: External ear normal.  Eyes: Conjunctivae are normal. Right eye exhibits no discharge. Left eye exhibits no discharge. No scleral icterus.  Neck: Neck supple. No tracheal deviation present.  Cardiovascular: Normal rate, regular rhythm and intact distal pulses.   Pulmonary/Chest: Effort  normal and breath sounds normal. No stridor. No respiratory distress. She has no wheezes. She has no rales.  Abdominal: Soft. Bowel sounds are normal. She exhibits no distension. There is no tenderness. There is no rebound and no guarding.  Musculoskeletal: She exhibits no edema or tenderness.  Neurological: She is alert. She has normal strength. No cranial nerve deficit (no facial droop, extraocular movements intact, no slurred speech) or sensory deficit. She exhibits normal muscle tone. She displays no seizure activity. Coordination normal.  Skin: Skin is warm and dry. No rash noted.  Psychiatric: She has a normal mood and affect.  Nursing note and vitals reviewed.   ED Course  Procedures (including critical care time) Labs Review Labs Reviewed  CBC - Abnormal; Notable for the following:    Hemoglobin 11.9 (*)    MCV 77.4 (*)    MCH 24.7 (*)    RDW 15.7 (*)    All other components within normal limits  I-STAT CHEM 8, ED - Abnormal; Notable for the following:    Glucose, Bld 118 (*)    All other components within normal limits  I-STAT TROPOININ, ED    Imaging Review Dg Chest 2 View  12/29/2014   CLINICAL DATA:  Three-day history of chest tightness and pain. Pain sharp in character, radiating toward back.  EXAM: CHEST  2 VIEW  COMPARISON:  None.  FINDINGS: Lungs are clear. Heart size and pulmonary vascularity are normal. No adenopathy. No pneumothorax. No bone lesions.  IMPRESSION: No abnormality noted.   Electronically Signed   By: Bretta BangWilliam  Woodruff M.D.   On: 12/29/2014 08:35     EKG Interpretation   Date/Time:  Saturday December 29 2014 07:23:24 EST Ventricular Rate:  74 PR Interval:  140 QRS Duration: 85 QT Interval:  405 QTC Calculation: 449 R Axis:   68 Text Interpretation:  Sinus rhythm Borderline T abnormalities, inferior  leads Baseline wander in lead(s) I II III aVR aVL aVF V1 V2 V3 No previous  tracing Confirmed by Courtlynn Holloman  MD-J, Quanta Robertshaw ([id number]) on 12/29/2014 7:41:00 AM       MDM   Final diagnoses:  Chest pain, unspecified chest pain type    Initially hypertensive but last BP at the bedside monitor was 144/88 without any treatment.  Doubt ACS, PNA dissection. No chest pain or shortness of breath today.  PERC negative.  Doubt PE.  At this time there does not appear to be any evidence of an acute emergency medical condition and the patient appears stable for discharge with appropriate outpatient follow up.       Linwood DibblesJon Ruston Fedora, MD 12/29/14 [id number]

## 2015-10-01 ENCOUNTER — Encounter: Payer: Self-pay | Admitting: Physical Medicine & Rehabilitation

## 2015-10-21 ENCOUNTER — Encounter: Payer: Medicaid Other | Attending: Physical Medicine & Rehabilitation

## 2015-10-21 ENCOUNTER — Ambulatory Visit: Payer: Medicaid Other | Admitting: Physical Medicine & Rehabilitation

## 2015-12-17 ENCOUNTER — Inpatient Hospital Stay (HOSPITAL_COMMUNITY)
Admission: RE | Admit: 2015-12-17 | Payer: Medicaid - Out of State | Source: Ambulatory Visit | Admitting: Obstetrics and Gynecology

## 2015-12-17 ENCOUNTER — Encounter (HOSPITAL_COMMUNITY): Admission: RE | Payer: Self-pay | Source: Ambulatory Visit

## 2015-12-17 SURGERY — Surgical Case
Anesthesia: Regional | Laterality: Bilateral

## 2016-04-02 ENCOUNTER — Ambulatory Visit
Admission: RE | Admit: 2016-04-02 | Discharge: 2016-04-02 | Disposition: A | Discharge status: Home / Self Care | Payer: Medicaid Other | Source: Ambulatory Visit | Attending: Family Medicine | Admitting: Family Medicine

## 2016-04-02 ENCOUNTER — Other Ambulatory Visit: Payer: Self-pay | Admitting: Family Medicine

## 2016-04-02 DIAGNOSIS — M545 Low back pain: Secondary | ICD-10-CM

## 2016-04-12 ENCOUNTER — Encounter (HOSPITAL_COMMUNITY): Payer: Self-pay

## 2016-04-12 ENCOUNTER — Ambulatory Visit (HOSPITAL_COMMUNITY)
Admission: EM | Admit: 2016-04-12 | Discharge: 2016-04-12 | Disposition: A | Discharge status: Home / Self Care | Payer: Medicaid Other | Attending: Emergency Medicine | Admitting: Emergency Medicine

## 2016-04-12 DIAGNOSIS — M25541 Pain in joints of right hand: Secondary | ICD-10-CM

## 2016-04-12 MED ORDER — TRAMADOL HCL 50 MG PO TABS: ORAL_TABLET | ORAL | Status: DC

## 2016-04-12 MED ORDER — IBUPROFEN 800 MG PO TABS: 800.0000 mg | ORAL_TABLET | Freq: Three times a day (TID) | ORAL | Status: DC | PRN

## 2016-04-12 NOTE — ED Notes (Signed)
Patient presents with injury to ring finger on rt hand, she states her finger has been swollen x1 week, patient states she does not remember having any injury to finger or hand. No acute distress

## 2016-04-12 NOTE — Discharge Instructions (Signed)
Wear the splint at all times for the next 3-4 days, then as needed for comfort.  Follow-up with Dr. Mina MarbleWeingold, if not getting any better after week to 10 days of relative rest and the ibuprofen. Go to the ER for the signs and symptoms we discussed.

## 2016-04-12 NOTE — ED Provider Notes (Signed)
HPI  SUBJECTIVE:  Sierra [name] is a right handed  30 y.o. female who presents with pain, swelling at the DIP of her right ring finger for the past week.she states that the finger "pops" with use. She states that it feels like it ousse out of place, butit does not get stuck notposition. No deformity. She describes pain as sore, tightness. Symptoms are worse with using and bending her finger, better with massage and wrapping it tightly. She is also tried heat and ibuprofen 600 mg. She denies any recent or remote history of trauma to the finger. No numbness, tingling, weakness, fevers, erythema.no antipyretic in the past 6-8 hours. She has a past medical history diabetes, hypertension, hypothyroidism, bipolar. No history of gout. PMD: Dr. Madilyn Hook. LMP: Now.    Past Medical History  Diagnosis Date  . Diabetes mellitus without complication (HCC)   . Thyroid disease   . Bipolar 1 disorder (HCC)   . Headache   . Hypothyroidism   . Bipolar disorder (HCC)   . GERD (gastroesophageal reflux disease)   . Arthritis     Past Surgical History  Procedure Laterality Date  . Cesarean section N/A 11/30/2014    Procedure: CESAREAN SECTION;  Surgeon: Oliver Pila, MD;  Location: [facility];  Service: Obstetrics;  Laterality: N/A;    No family history on file.  Social History  Substance Use Topics  . Smoking status: Never Smoker   . Smokeless tobacco: Never Used  . Alcohol Use: No    No current facility-administered medications for this encounter.  Current outpatient prescriptions:  .  levothyroxine (SYNTHROID, LEVOTHROID) 200 MCG tablet, Take 200 mcg by mouth daily before breakfast. , Disp: , Rfl:  .  metFORMIN (GLUCOPHAGE) 500 MG tablet, Take 500 mg by mouth daily., Disp: , Rfl:  .  ibuprofen (ADVIL,MOTRIN) 800 MG tablet, Take 1 tablet (800 mg total) by mouth every 8 (eight) hours as needed., Disp: 30 tablet, Rfl: 0 .  insulin aspart (NOVOLOG) 100 UNIT/ML injection, Inject 8 Units into the  skin 3 (three) times daily with meals., Disp: 10 mL, Rfl: 11 .  insulin glargine (LANTUS) 100 UNIT/ML injection, Inject 0.22 mLs (22 Units total) into the skin at bedtime. (Patient taking differently: Inject 20 Units into the skin at bedtime. ), Disp: 10 mL, Rfl: 11 .  Prenatal Vit-Fe Fumarate-FA (PRENATAL MULTIVITAMIN) TABS tablet, Take 1 tablet by mouth daily at 12 noon., Disp: , Rfl:  .  traMADol (ULTRAM) 50 MG tablet, 1-2 tabs po q 6 hr prn pain Maximum dose= 8 tablets per day, Disp: 20 tablet, Rfl: 0  Allergies  Allergen Reactions  . Flonase [Fluticasone Propionate] Swelling and Other (See Comments)    Nose Bleeds   . Naproxen Other (See Comments)    Blood pressure rises     ROS  As noted in HPI.   Physical Exam  BP 143/96 mmHg  Pulse 64  Temp(Src) 98.2 F (36.8 C) (Oral)  Resp 20  SpO2 99%  LMP 04/08/2016 (Exact Date)  Constitutional: Well developed, well nourished, no acute distress Eyes:  EOMI, conjunctiva normal bilaterally HENT: Normocephalic, atraumatic,mucus membranes moist Respiratory: Normal inspiratory effort Cardiovascular: Normal rate GI: nondistended skin: No rash, skin intact Musculoskeletal: tenderness, mild swelling along the lateral aspect at the DIP of the right ring finger. DIIP, PIP joints stable varus/valgus stress. FDP, FDS intact. patient able to flex/extend against resistance without pain. No weakness. No tenderness along the flexor tendons. Sensation grossly intact. Cap refill less than 2  seconds. No erythema. patient able to bend finger at the DIP and PIP. No other evidence of injury to the hand. Hand exam within normal limits. Neurologic: Alert & oriented x 3, no focal neuro deficits Psychiatric: Speech and behavior appropriate   ED Course   Medications - No data to display  Orders Placed This Encounter  Procedures  . Apply finger splint static    Standing Status: Standing     Number of Occurrences: 1     Standing Expiration Date:      Order Specific Question:  Laterality    Answer:  Right    No results found for this or any previous visit (from the past 24 hour(s)). No results found.  ED Clinical Impression  Joint pain in fingers of right hand   ED Assessment/Plan   In the absence of trauma and patient able to perform full range of motion, doubt fracture. Deferred imaging today. she may be subluxing the joint, but it is stable in my exam today. There is no evidence of septic joint, flexor tenosynovitis. Will splint in extension, home with 800 mg of ibuprofen, tramadol, refer to Dr. Mina MarbleWeingold, hand surgeon on call, if not getting better with a week of rest.  Examination post splint. Cap refill less than 2 seconds.  Discussed MDM, plan and followup with patient. Discussed sn/sx that should prompt return to the   ED. Patient agrees with plan.   *This clinic note was created using Dragon dictation software. Therefore, there may be occasional mistakes despite careful proofreading.  ?   Domenick GongAshley Haron Beilke, MD 04/12/16 [facility] [id number]

## 2016-04-23 ENCOUNTER — Other Ambulatory Visit: Payer: Self-pay | Admitting: Family Medicine

## 2016-04-23 DIAGNOSIS — M545 Low back pain: Secondary | ICD-10-CM

## 2016-05-01 ENCOUNTER — Other Ambulatory Visit: Payer: Medicaid Other

## 2016-05-29 ENCOUNTER — Other Ambulatory Visit: Payer: Medicaid Other

## 2016-06-17 ENCOUNTER — Other Ambulatory Visit: Payer: Medicaid Other

## 2016-06-28 ENCOUNTER — Encounter (HOSPITAL_COMMUNITY): Payer: Self-pay | Admitting: Emergency Medicine

## 2016-06-28 ENCOUNTER — Emergency Department (HOSPITAL_COMMUNITY)
Admission: EM | Admit: 2016-06-28 | Discharge: 2016-06-28 | Disposition: A | Discharge status: Home / Self Care | Payer: Medicaid Other | Attending: Emergency Medicine | Admitting: Emergency Medicine

## 2016-06-28 DIAGNOSIS — Z79899 Other long term (current) drug therapy: Secondary | ICD-10-CM | POA: Insufficient documentation

## 2016-06-28 DIAGNOSIS — E119 Type 2 diabetes mellitus without complications: Secondary | ICD-10-CM | POA: Diagnosis not present

## 2016-06-28 DIAGNOSIS — G8929 Other chronic pain: Secondary | ICD-10-CM | POA: Diagnosis not present

## 2016-06-28 DIAGNOSIS — I10 Essential (primary) hypertension: Secondary | ICD-10-CM | POA: Insufficient documentation

## 2016-06-28 DIAGNOSIS — Z794 Long term (current) use of insulin: Secondary | ICD-10-CM | POA: Diagnosis not present

## 2016-06-28 DIAGNOSIS — M545 Low back pain: Secondary | ICD-10-CM | POA: Diagnosis not present

## 2016-06-28 DIAGNOSIS — Z7984 Long term (current) use of oral hypoglycemic drugs: Secondary | ICD-10-CM | POA: Diagnosis not present

## 2016-06-28 HISTORY — DX: Essential (primary) hypertension: I10

## 2016-06-28 MED ORDER — CYCLOBENZAPRINE HCL 10 MG PO TABS: 10.0000 mg | ORAL_TABLET | Freq: Three times a day (TID) | ORAL | Status: DC | PRN

## 2016-06-28 MED ORDER — KETOROLAC TROMETHAMINE 60 MG/2ML IM SOLN
60.0000 mg | Freq: Once | INTRAMUSCULAR | Status: AC
  Administered 2016-06-28: 60 mg via INTRAMUSCULAR
  Filled 2016-06-28: qty 2

## 2016-06-28 MED ORDER — OXYCODONE-ACETAMINOPHEN 5-325 MG PO TABS: 1.0000 | ORAL_TABLET | ORAL | Status: AC | PRN

## 2016-06-28 MED ORDER — IBUPROFEN 800 MG PO TABS: 800.0000 mg | ORAL_TABLET | Freq: Three times a day (TID) | ORAL | Status: AC | PRN

## 2016-06-28 NOTE — ED Provider Notes (Signed)
CSN: [id number]     Arrival date & time 06/28/16  0321 History   First MD Initiated Contact with Patient 06/28/16 0531     Chief Complaint  Patient presents with  . Back Pain     (Consider location/radiation/quality/duration/timing/severity/associated sxs/prior Treatment) Patient is a 30 y.o. female presenting with back pain. The history is provided by the patient.  Back Pain She has been having low back pain for several years. Over the last several weeks, pain has gotten worse. Pain starts in the lower back and radiates all the way up into her neck and down into her left leg. There is no associated bowel or bladder incontinence. There is some numbness in her left leg but no weakness. She has been taking ibuprofen without relief. She states that prior to moving down here, her physician and had her on some treatments which had been helpful but they have not been able to be continued since moving down here. She rates pain at 10/10.  Past Medical History  Diagnosis Date  . Diabetes mellitus without complication (HCC)   . Thyroid disease   . Bipolar 1 disorder (HCC)   . Headache   . Hypothyroidism   . Bipolar disorder (HCC)   . GERD (gastroesophageal reflux disease)   . Arthritis   . Hypertension    Past Surgical History  Procedure Laterality Date  . Cesarean section N/A 11/30/2014    Procedure: CESAREAN SECTION;  Surgeon: Oliver Pila, MD;  Location: [facility];  Service: Obstetrics;  Laterality: N/A;   No family history on file. Social History  Substance Use Topics  . Smoking status: Never Smoker   . Smokeless tobacco: Never Used  . Alcohol Use: No   OB History    Gravida Para Term Preterm AB TAB SAB Ectopic Multiple Living   0 2     Review of Systems  Musculoskeletal: Positive for back pain.  All other systems reviewed and are negative.     Allergies  Flonase and Naproxen  Home Medications   Prior to Admission medications   Medication Sig Start Date  End Date Taking? Authorizing Provider  cyclobenzaprine (FLEXERIL) 10 MG tablet Take 1 tablet (10 mg total) by mouth 3 (three) times daily as needed for muscle spasms. 06/28/16   Dione Booze, MD  ibuprofen (ADVIL,MOTRIN) 800 MG tablet Take 1 tablet (800 mg total) by mouth every 8 (eight) hours as needed. 06/28/16   Dione Booze, MD  insulin aspart (NOVOLOG) 100 UNIT/ML injection Inject 8 Units into the skin 3 (three) times daily with meals. 12/03/14   Huel Cote, MD  insulin glargine (LANTUS) 100 UNIT/ML injection Inject 0.22 mLs (22 Units total) into the skin at bedtime. Patient taking differently: Inject 20 Units into the skin at bedtime.  12/03/14   Huel Cote, MD  levothyroxine (SYNTHROID, LEVOTHROID) 200 MCG tablet Take 200 mcg by mouth daily before breakfast.     Historical Provider, MD  metFORMIN (GLUCOPHAGE) 500 MG tablet Take 500 mg by mouth daily.    Historical Provider, MD  oxyCODONE-acetaminophen (PERCOCET) 5-325 MG tablet Take 1 tablet by mouth every 4 (four) hours as needed for moderate pain. 06/28/16   Dione Booze, MD  Prenatal Vit-Fe Fumarate-FA (PRENATAL MULTIVITAMIN) TABS tablet Take 1 tablet by mouth daily at 12 noon.    Historical Provider, MD  traMADol (ULTRAM) 50 MG tablet 1-2 tabs po q 6 hr prn pain Maximum dose= 8 tablets per day 04/12/16  Domenick GongAshley Mortenson, MD   BP 145/89 mmHg  Pulse 69  Temp(Src) 98.3 F (36.8 C) (Oral)  Resp 19  SpO2 99%  LMP 06/17/2016 Physical Exam  Nursing note and vitals reviewed.  30 year old female, resting comfortably and in no acute distress. Vital signs are significant for hypertension. Oxygen saturation is 99%, which is normal. Head is normocephalic and atraumatic. PERRLA, EOMI. Oropharynx is clear. Neck is nontender and supple without adenopathy or JVD. Back is moderately tender in the lower lumbar area with moderate to severe bilateral paralumbar spasm. Straight leg raise is positive bilaterally at 15. There is no CVA  tenderness. Lungs are clear without rales, wheezes, or rhonchi. Chest is nontender. Heart has regular rate and rhythm without murmur. Abdomen is soft, flat, nontender without masses or hepatosplenomegaly and peristalsis is normoactive. Extremities have no cyanosis or edema, full range of motion is present. Skin is warm and dry without rash. Neurologic: Mental status is normal, cranial nerves are intact, there are no motor or sensory deficits.  ED Course  Procedures (including critical care time)  MDM   Final diagnoses:  Acute exacerbation of chronic low back pain    Exacerbation of low back pain. Old records are reviewed and she had been seen in the ED several years ago with back pain. She received injection of control active that time and patient stated that it gave her significant relief. She's given an injection of ketorolac here and sent home with prescriptions for ibuprofen, cyclobenzaprine, and oxycodone have acetaminophen. She is referred back to her primary care provider to arrange for appropriate physical therapy.    Dione Boozeavid Eller Sweis, MD 06/28/16 [phone]

## 2016-06-28 NOTE — ED Notes (Signed)
Pt reports generalized back pain which started a month ago in her lower back and has progressed upwards. Pt reports medications are not working. PT alert x4. NAD at this time.

## 2016-06-28 NOTE — Discharge Instructions (Signed)
Back Pain, Adult °Back pain is very common in adults. The cause of back pain is rarely dangerous and the pain often gets better over time. The cause of your back pain may not be known. Some common causes of back pain include: °1. Strain of the muscles or ligaments supporting the spine. °2. Wear and tear (degeneration) of the spinal disks. °3. Arthritis. °4. Direct injury to the back. °For many people, back pain may return. Since back pain is rarely dangerous, most people can learn to manage this condition on their own. °HOME CARE INSTRUCTIONS °Watch your back pain for any changes. The following actions may help to lessen any discomfort you are feeling: °1. Remain active. It is stressful on your back to sit or stand in one place for long periods of time. Do not sit, drive, or stand in one place for more than 30 minutes at a time. Take short walks on even surfaces as soon as you are able. Try to increase the length of time you walk each day. °2. Exercise regularly as directed by your health care provider. Exercise helps your back heal faster. It also helps avoid future injury by keeping your muscles strong and flexible. °3. Do not stay in bed. Resting more than 1-2 days can delay your recovery. °4. Pay attention to your body when you bend and lift. The most comfortable positions are those that put less stress on your recovering back. Always use proper lifting techniques, including: °1. Bending your knees. °2. Keeping the load close to your body. °3. Avoiding twisting. °5. Find a comfortable position to sleep. Use a firm mattress and lie on your side with your knees slightly bent. If you lie on your back, put a pillow under your knees. °6. Avoid feeling anxious or stressed. Stress increases muscle tension and can worsen back pain. It is important to recognize when you are anxious or stressed and learn ways to manage it, such as with exercise. °7. Take medicines only as directed by your health care provider.  Over-the-counter medicines to reduce pain and inflammation are often the most helpful. Your health care provider may prescribe muscle relaxant drugs. These medicines help dull your pain so you can more quickly return to your normal activities and healthy exercise. °8. Apply ice to the injured area: °1. Put ice in a plastic bag. °2. Place a towel between your skin and the bag. °3. Leave the ice on for 20 minutes, 2-3 times a day for the first 2-3 days. After that, ice and heat may be alternated to reduce pain and spasms. °9. Maintain a healthy weight. Excess weight puts extra stress on your back and makes it difficult to maintain good posture. °SEEK MEDICAL CARE IF: °1. You have pain that is not relieved with rest or medicine. °2. You have increasing pain going down into the legs or buttocks. °3. You have pain that does not improve in one week. °4. You have night pain. °5. You lose weight. °6. You have a fever or chills. °SEEK IMMEDIATE MEDICAL CARE IF:  °1. You develop new bowel or bladder control problems. °2. You have unusual weakness or numbness in your arms or legs. °3. You develop nausea or vomiting. °4. You develop abdominal pain. °5. You feel faint. °  °This information is not intended to replace advice given to you by your health care provider. Make sure you discuss any questions you have with your health care provider. °  °Document Released: 12/07/2005 Document Revised: 12/28/2014 Document Reviewed: 04/10/2014 °Elsevier Interactive Patient Education ©2016 [employer]   Back Exercises The following exercises strengthen the muscles that help to support the back. They also help to keep the lower back flexible. Doing these exercises can help to prevent back pain or lessen existing pain. If you have back pain or discomfort, try doing these exercises 2-3 times each day or as told by your health care provider. When the pain goes away, do them once each day, but increase the number of times that you repeat  the steps for each exercise (do more repetitions). If you do not have back pain or discomfort, do these exercises once each day or as told by your health care provider. EXERCISES Single Knee to Chest Repeat these steps 3-5 times for each leg: 5. Lie on your back on a firm bed or the floor with your legs extended. 6. Bring one knee to your chest. Your other leg should stay extended and in contact with the floor. 7. Hold your knee in place by grabbing your knee or thigh. 8. Pull on your knee until you feel a gentle stretch in your lower back. 9. Hold the stretch for 10-30 seconds. 10. Slowly release and straighten your leg. Pelvic Tilt Repeat these steps 5-10 times: 10. Lie on your back on a firm bed or the floor with your legs extended. 11. Bend your knees so they are pointing toward the ceiling and your feet are flat on the floor. 12. Tighten your lower abdominal muscles to press your lower back against the floor. This motion will tilt your pelvis so your tailbone points up toward the ceiling instead of pointing to your feet or the floor. 13. With gentle tension and even breathing, hold this position for 5-10 seconds. Cat-Cow Repeat these steps until your lower back becomes more flexible: 7. Get into a hands-and-knees position on a firm surface. Keep your hands under your shoulders, and keep your knees under your hips. You may place padding under your knees for comfort. 8. Let your head hang down, and point your tailbone toward the floor so your lower back becomes rounded like the back of a cat. 9. Hold this position for 5 seconds. 10. Slowly lift your head and point your tailbone up toward the ceiling so your back forms a sagging arch like the back of a cow. 11. Hold this position for 5 seconds. Press-Ups Repeat these steps 5-10 times: 6. Lie on your abdomen (face-down) on the floor. 7. Place your palms near your head, about shoulder-width apart. 8. While you keep your back as relaxed as  possible and keep your hips on the floor, slowly straighten your arms to raise the top half of your body and lift your shoulders. Do not use your back muscles to raise your upper torso. You may adjust the placement of your hands to make yourself more comfortable. 9. Hold this position for 5 seconds while you keep your back relaxed. 10. Slowly return to lying flat on the floor. Bridges Repeat these steps 10 times: 1. Lie on your back on a firm surface. 2. Bend your knees so they are pointing toward the ceiling and your feet are flat on the floor. 3. Tighten your buttocks muscles and lift your buttocks off of the floor until your waist is at almost the same height as your knees. You should feel the muscles working in your buttocks and the back of your thighs. If you do not feel these muscles, slide your feet 1-2 inches farther away from your buttocks. 4. Hold  this position for 3-5 seconds. 5. Slowly lower your hips to the starting position, and allow your buttocks muscles to relax completely. If this exercise is too easy, try doing it with your arms crossed over your chest. Abdominal Crunches Repeat these steps 5-10 times: 1. Lie on your back on a firm bed or the floor with your legs extended. 2. Bend your knees so they are pointing toward the ceiling and your feet are flat on the floor. 3. Cross your arms over your chest. 4. Tip your chin slightly toward your chest without bending your neck. 5. Tighten your abdominal muscles and slowly raise your trunk (torso) high enough to lift your shoulder blades a tiny bit off of the floor. Avoid raising your torso higher than that, because it can put too much stress on your low back and it does not help to strengthen your abdominal muscles. 6. Slowly return to your starting position. Back Lifts Repeat these steps 5-10 times: 1. Lie on your abdomen (face-down) with your arms at your sides, and rest your forehead on the floor. 2. Tighten the muscles in your  legs and your buttocks. 3. Slowly lift your chest off of the floor while you keep your hips pressed to the floor. Keep the back of your head in line with the curve in your back. Your eyes should be looking at the floor. 4. Hold this position for 3-5 seconds. 5. Slowly return to your starting position. SEEK MEDICAL CARE IF:  Your back pain or discomfort gets much worse when you do an exercise.  Your back pain or discomfort does not lessen within 2 hours after you exercise. If you have any of these problems, stop doing these exercises right away. Do not do them again unless your health care provider says that you can. SEEK IMMEDIATE MEDICAL CARE IF:  You develop sudden, severe back pain. If this happens, stop doing the exercises right away. Do not do them again unless your health care provider says that you can.   This information is not intended to replace advice given to you by your health care provider. Make sure you discuss any questions you have with your health care provider.   Document Released: 01/14/2005 Document Revised: 08/28/2015 Document Reviewed: 01/31/2015 Else[employer] Interactive Patient Education 2016 [employer]  Ibuprofen tablets and capsules What is this medicine? IBUPROFEN (eye BYOO proe fen) is a non-steroidal anti-inflammatory drug (NSAID). It is used for dental pain, fever, headaches or migraines, osteoarthritis, rheumatoid arthritis, or painful monthly periods. It can also relieve minor aches and pains caused by a cold, flu, or sore throat. This medicine may be used for other purposes; ask your health care provider or pharmacist if you have questions. What should I tell my health care provider before I take this medicine? They need to know if you have any of these conditions: -asthma -cigarette smoker -drink more than 3 alcohol containing drinks a day -heart disease or circulation problems such as heart failure or leg edema (fluid retention) -high blood  pressure -kidney disease -liver disease -stomach bleeding or ulcers -an unusual or allergic reaction to ibuprofen, aspirin, other NSAIDS, other medicines, foods, dyes, or preservatives -pregnant or trying to get pregnant -breast-feeding How should I use this medicine? Take this medicine by mouth with a glass of water. Follow the directions on the prescription label. Take this medicine with food if your stomach gets upset. Try to not lie down for at least 10 minutes after you take the medicine. Take  your medicine at regular intervals. Do not take your medicine more often than directed. A special MedGuide will be given to you by the pharmacist with each prescription and refill. Be sure to read this information carefully each time. Talk to your pediatrician regarding the use of this medicine in children. Special care may be needed. Overdosage: If you think you have taken too much of this medicine contact a poison control center or emergency room at once. NOTE: This medicine is only for you. Do not share this medicine with others. What if I miss a dose? If you miss a dose, take it as soon as you can. If it is almost time for your next dose, take only that dose. Do not take double or extra doses. What may interact with this medicine? Do not take this medicine with any of the following medications: -cidofovir -ketorolac -methotrexate -pemetrexed This medicine may also interact with the following medications: -alcohol -aspirin -diuretics -lithium -other drugs for inflammation like prednisone -warfarin This list may not describe all possible interactions. Give your health care provider a list of all the medicines, herbs, non-prescription drugs, or dietary supplements you use. Also tell them if you smoke, drink alcohol, or use illegal drugs. Some items may interact with your medicine. What should I watch for while using this medicine? Tell your doctor or healthcare professional if your symptoms  do not start to get better or if they get worse. This medicine does not prevent heart attack or stroke. In fact, this medicine may increase the chance of a heart attack or stroke. The chance may increase with longer use of this medicine and in people who have heart disease. If you take aspirin to prevent heart attack or stroke, talk with your doctor or health care professional. Do not take other medicines that contain aspirin, ibuprofen, or naproxen with this medicine. Side effects such as stomach upset, nausea, or ulcers may be more likely to occur. Many medicines available without a prescription should not be taken with this medicine. This medicine can cause ulcers and bleeding in the stomach and intestines at any time during treatment. Ulcers and bleeding can happen without warning symptoms and can cause death. To reduce your risk, do not smoke cigarettes or drink alcohol while you are taking this medicine. You may get drowsy or dizzy. Do not drive, use machinery, or do anything that needs mental alertness until you know how this medicine affects you. Do not stand or sit up quickly, especially if you are an older patient. This reduces the risk of dizzy or fainting spells. This medicine can cause you to bleed more easily. Try to avoid damage to your teeth and gums when you brush or floss your teeth. This medicine may be used to treat migraines. If you take migraine medicines for 10 or more days a month, your migraines may get worse. Keep a diary of headache days and medicine use. Contact your healthcare professional if your migraine attacks occur more frequently. What side effects may I notice from receiving this medicine? Side effects that you should report to your doctor or health care professional as soon as possible: -allergic reactions like skin rash, itching or hives, swelling of the face, lips, or tongue -severe stomach pain -signs and symptoms of bleeding such as bloody or black, tarry stools;  red or dark-brown urine; spitting up blood or brown material that looks like coffee grounds; red spots on the skin; unusual bruising or bleeding from the eye, gums, or nose -  signs and symptoms of a blood clot such as changes in vision; chest pain; severe, sudden headache; trouble speaking; sudden numbness or weakness of the face, arm, or leg -unexplained weight gain or swelling -unusually weak or tired -yellowing of eyes or skin Side effects that usually do not require medical attention (report to your doctor or health care professional if they continue or are bothersome): -bruising -diarrhea -dizziness, drowsiness -headache -nausea, vomiting This list may not describe all possible side effects. Call your doctor for medical advice about side effects. You may report side effects to FDA at 1-800-FDA-1088. Where should I keep my medicine? Keep out of the reach of children. Store at room temperature between 15 and 30 degrees C (59 and 86 degrees F). Keep container tightly closed. Throw away any unused medicine after the expiration date. NOTE: This sheet is a summary. It may not cover all possible information. If you have questions about this medicine, talk to your doctor, pharmacist, or health care provider.    2016, [employer] Standard. (2013-08-08 10:48:02)  Cyclobenzaprine tablets What is this medicine? CYCLOBENZAPRINE (sye kloe BEN za preen) is a muscle relaxer. It is used to treat muscle pain, spasms, and stiffness. This medicine may be used for other purposes; ask your health care provider or pharmacist if you have questions. What should I tell my health care provider before I take this medicine? They need to know if you have any of these conditions: -heart disease, irregular heartbeat, or previous heart attack -liver disease -thyroid problem -an unusual or allergic reaction to cyclobenzaprine, tricyclic antidepressants, lactose, other medicines, foods, dyes, or  preservatives -pregnant or trying to get pregnant -breast-feeding How should I use this medicine? Take this medicine by mouth with a glass of water. Follow the directions on the prescription label. If this medicine upsets your stomach, take it with food or milk. Take your medicine at regular intervals. Do not take it more often than directed. Talk to your pediatrician regarding the use of this medicine in children. Special care may be needed. Overdosage: If you think you have taken too much of this medicine contact a poison control center or emergency room at once. NOTE: This medicine is only for you. Do not share this medicine with others. What if I miss a dose? If you miss a dose, take it as soon as you can. If it is almost time for your next dose, take only that dose. Do not take double or extra doses. What may interact with this medicine? Do not take this medicine with any of the following medications: -certain medicines for fungal infections like fluconazole, itraconazole, ketoconazole, posaconazole, voriconazole -cisapride -dofetilide -dronedarone -droperidol -flecainide -grepafloxacin -halofantrine -levomethadyl -MAOIs like Carbex, Eldepryl, Marplan, Nardil, and Parnate -nilotinib -pimozide -probucol -sertindole -thioridazine -ziprasidone This medicine may also interact with the following medications: -abarelix -alcohol -certain medicines for cancer -certain medicines for depression, anxiety, or psychotic disturbances -certain medicines for infection like alfuzosin, chloroquine, clarithromycin, levofloxacin, mefloquine, pentamidine, troleandomycin -certain medicines for an irregular heart beat -certain medicines used for sleep or numbness during surgery or procedure -contrast dyes -dolasetron -guanethidine -methadone -octreotide -ondansetron -other medicines that prolong the QT interval (cause an abnormal heart rhythm) -palonosetron -phenothiazines like  chlorpromazine, mesoridazine, prochlorperazine, thioridazine -tramadol -vardenafil This list may not describe all possible interactions. Give your health care provider a list of all the medicines, herbs, non-prescription drugs, or dietary supplements you use. Also tell them if you smoke, drink alcohol, or use illegal drugs. Some items may interact with  your medicine. What should I watch for while using this medicine? Check with your doctor or health care professional if your condition does not improve within 1 to 3 weeks. You may get drowsy or dizzy when you first start taking the medicine or change doses. Do not drive, use machinery, or do anything that may be dangerous until you know how the medicine affects you. Stand or sit up slowly. Your mouth may get dry. Drinking water, chewing sugarless gum, or sucking on hard candy may help. What side effects may I notice from receiving this medicine? Side effects that you should report to your doctor or health care professional as soon as possible: -allergic reactions like skin rash, itching or hives, swelling of the face, lips, or tongue -chest pain -fast heartbeat -hallucinations -seizures -vomiting Side effects that usually do not require medical attention (report to your doctor or health care professional if they continue or are bothersome): -headache This list may not describe all possible side effects. Call your doctor for medical advice about side effects. You may report side effects to FDA at 1-800-FDA-1088. Where should I keep my medicine? Keep out of the reach of children. Store at room temperature between 15 and 30 degrees C (59 and 86 degrees F). Keep container tightly closed. Throw away any unused medicine after the expiration date. NOTE: This sheet is a summary. It may not cover all possible information. If you have questions about this medicine, talk to your doctor, pharmacist, or health care provider.    2016, [employer]/Gold  Standard. (2013-07-04 12:48:19)  Acetaminophen; Oxycodone tablets What is this medicine? ACETAMINOPHEN; OXYCODONE (a set a MEE noe fen; ox i KOE done) is a pain reliever. It is used to treat moderate to severe pain. This medicine may be used for other purposes; ask your health care provider or pharmacist if you have questions. What should I tell my health care provider before I take this medicine? They need to know if you have any of these conditions: -brain tumor -Crohn's disease, inflammatory bowel disease, or ulcerative colitis -drug abuse or addiction -head injury -heart or circulation problems -if you often drink alcohol -kidney disease or problems going to the bathroom -liver disease -lung disease, asthma, or breathing problems -an unusual or allergic reaction to acetaminophen, oxycodone, other opioid analgesics, other medicines, foods, dyes, or preservatives -pregnant or trying to get pregnant -breast-feeding How should I use this medicine? Take this medicine by mouth with a full glass of water. Follow the directions on the prescription label. You can take it with or without food. If it upsets your stomach, take it with food. Take your medicine at regular intervals. Do not take it more often than directed. Talk to your pediatrician regarding the use of this medicine in children. Special care may be needed. Patients over 25 years old may have a stronger reaction and need a smaller dose. Overdosage: If you think you have taken too much of this medicine contact a poison control center or emergency room at once. NOTE: This medicine is only for you. Do not share this medicine with others. What if I miss a dose? If you miss a dose, take it as soon as you can. If it is almost time for your next dose, take only that dose. Do not take double or extra doses. What may interact with this medicine? -alcohol -antihistamines -barbiturates like amobarbital, butalbital, butabarbital,  methohexital, pentobarbital, phenobarbital, thiopental, and secobarbital -benztropine -drugs for bladder problems like solifenacin, trospium, oxybutynin, tolterodine, hyoscyamine,  and methscopolamine -drugs for breathing problems like ipratropium and tiotropium -drugs for certain stomach or intestine problems like propantheline, homatropine methylbromide, glycopyrrolate, atropine, belladonna, and dicyclomine -general anesthetics like etomidate, ketamine, nitrous oxide, propofol, desflurane, enflurane, halothane, isoflurane, and sevoflurane -medicines for depression, anxiety, or psychotic disturbances -medicines for sleep -muscle relaxants -naltrexone -narcotic medicines (opiates) for pain -phenothiazines like perphenazine, thioridazine, chlorpromazine, mesoridazine, fluphenazine, prochlorperazine, promazine, and trifluoperazine -scopolamine -tramadol -trihexyphenidyl This list may not describe all possible interactions. Give your health care provider a list of all the medicines, herbs, non-prescription drugs, or dietary supplements you use. Also tell them if you smoke, drink alcohol, or use illegal drugs. Some items may interact with your medicine. What should I watch for while using this medicine? Tell your doctor or health care professional if your pain does not go away, if it gets worse, or if you have new or a different type of pain. You may develop tolerance to the medicine. Tolerance means that you will need a higher dose of the medication for pain relief. Tolerance is normal and is expected if you take this medicine for a long time. Do not suddenly stop taking your medicine because you may develop a severe reaction. Your body becomes used to the medicine. This does NOT mean you are addicted. Addiction is a behavior related to getting and using a drug for a non-medical reason. If you have pain, you have a medical reason to take pain medicine. Your doctor will tell you how much medicine to  take. If your doctor wants you to stop the medicine, the dose will be slowly lowered over time to avoid any side effects. You may get drowsy or dizzy. Do not drive, use machinery, or do anything that needs mental alertness until you know how this medicine affects you. Do not stand or sit up quickly, especially if you are an older patient. This reduces the risk of dizzy or fainting spells. Alcohol may interfere with the effect of this medicine. Avoid alcoholic drinks. There are different types of narcotic medicines (opiates) for pain. If you take more than one type at the same time, you may have more side effects. Give your health care provider a list of all medicines you use. Your doctor will tell you how much medicine to take. Do not take more medicine than directed. Call emergency for help if you have problems breathing. The medicine will cause constipation. Try to have a bowel movement at least every 2 to 3 days. If you do not have a bowel movement for 3 days, call your doctor or health care professional. Do not take Tylenol (acetaminophen) or medicines that have acetaminophen with this medicine. Too much acetaminophen can be very dangerous. Many nonprescription medicines contain acetaminophen. Always read the labels carefully to avoid taking more acetaminophen. What side effects may I notice from receiving this medicine? Side effects that you should report to your doctor or health care professional as soon as possible: -allergic reactions like skin rash, itching or hives, swelling of the face, lips, or tongue -breathing difficulties, wheezing -confusion -light headedness or fainting spells -severe stomach pain -unusually weak or tired -yellowing of the skin or the whites of the eyes Side effects that usually do not require medical attention (report to your doctor or health care professional if they continue or are bothersome): -dizziness -drowsiness -nausea -vomiting This list may not describe  all possible side effects. Call your doctor for medical advice about side effects. You may report side effects to FDA  at 1-800-FDA-1088. Where should I keep my medicine? Keep out of the reach of children. This medicine can be abused. Keep your medicine in a safe place to protect it from theft. Do not share this medicine with anyone. Selling or giving away this medicine is dangerous and against the law. This medicine may cause accidental overdose and death if it taken by other adults, children, or pets. Mix any unused medicine with a substance like cat litter or coffee grounds. Then throw the medicine away in a sealed container like a sealed bag or a coffee can with a lid. Do not use the medicine after the expiration date. Store at room temperature between 20 and 25 degrees C (68 and 77 degrees F). NOTE: This sheet is a summary. It may not cover all possible information. If you have questions about this medicine, talk to your doctor, pharmacist, or health care provider.    2016, [employer]. (2014-11-07 15:18:46)

## 2017-10-28 IMAGING — CR DG LUMBAR SPINE COMPLETE 4+V
5 series · 5 of 5 positions shown · non-contrast
Comparison: None

CLINICAL DATA: Chronic worsening low back pain radiating down LEFT
leg, history diabetes mellitus

EXAM:
LUMBAR SPINE - COMPLETE 4+ VIEW

[t l-spine a.p.]
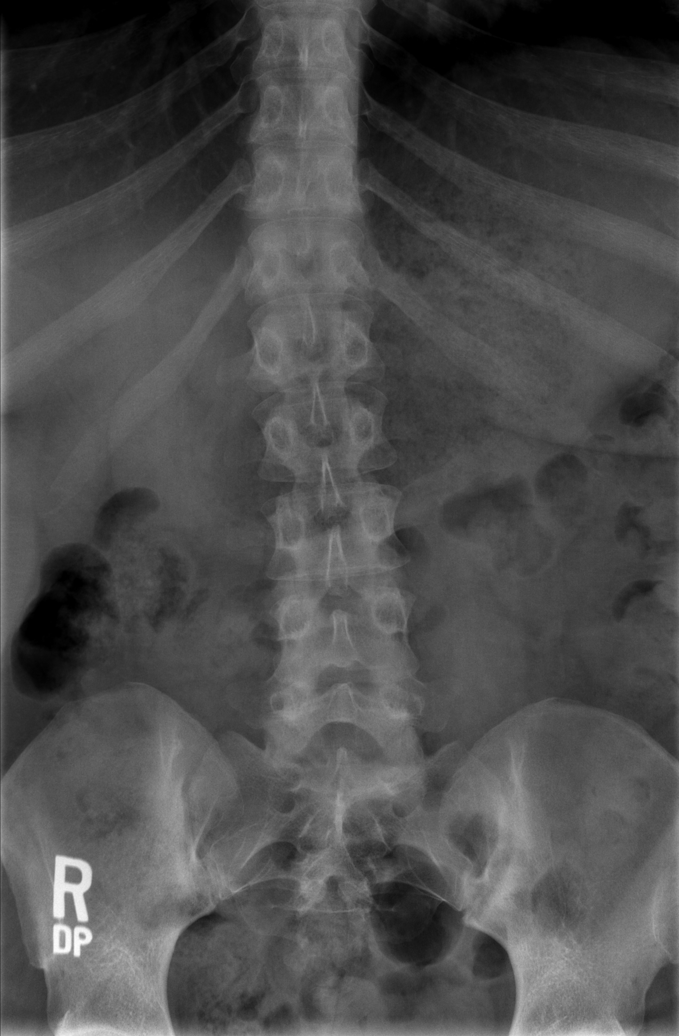

[t l-spine oblique exposure (1 of 2)]
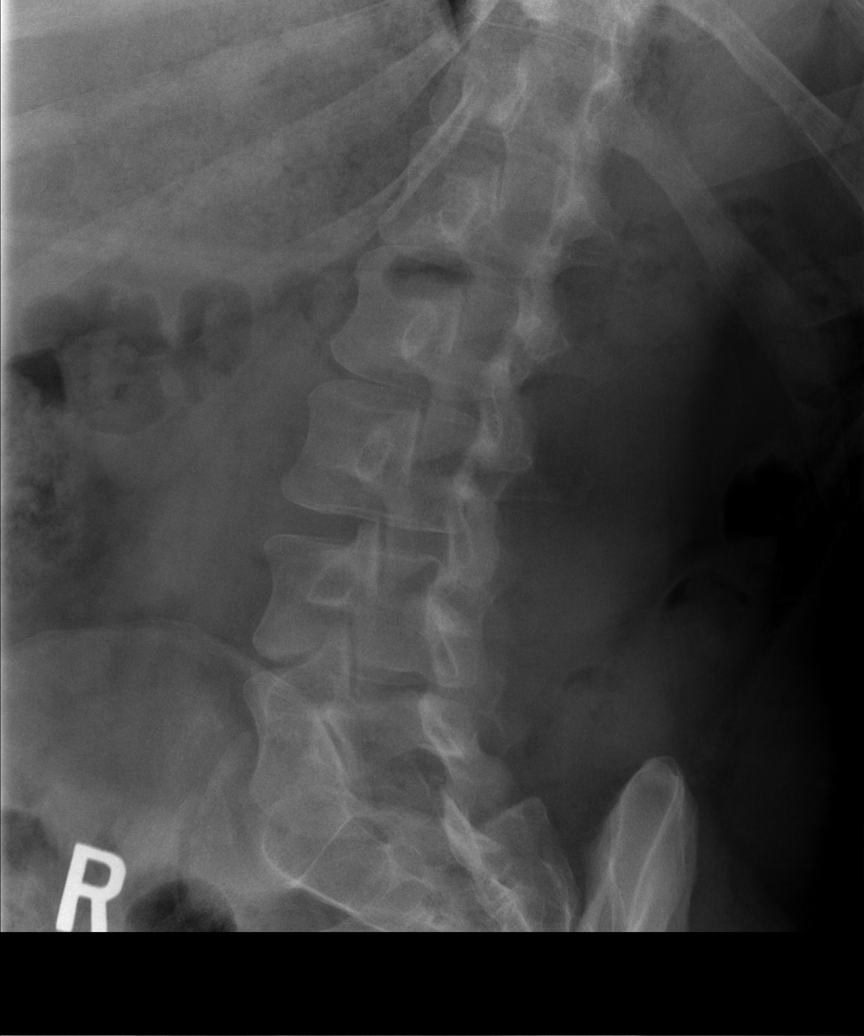

[t l-spine oblique exposure (2 of 2)]
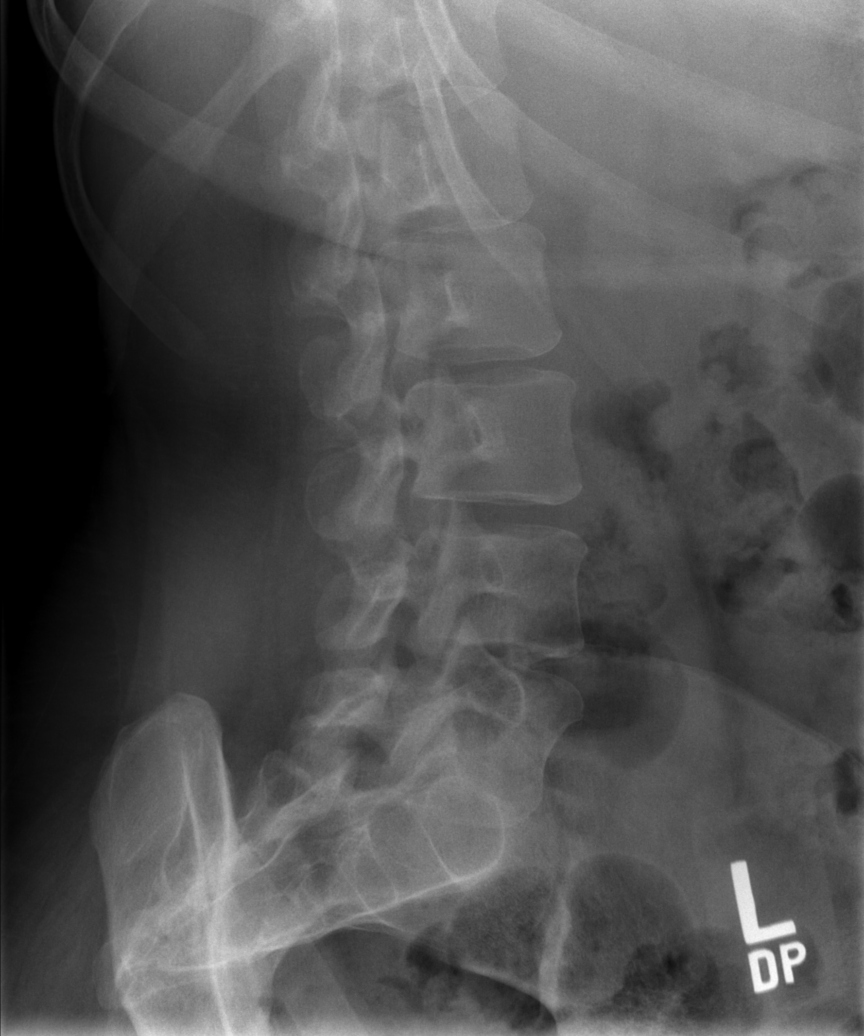

[t l-spine lat]
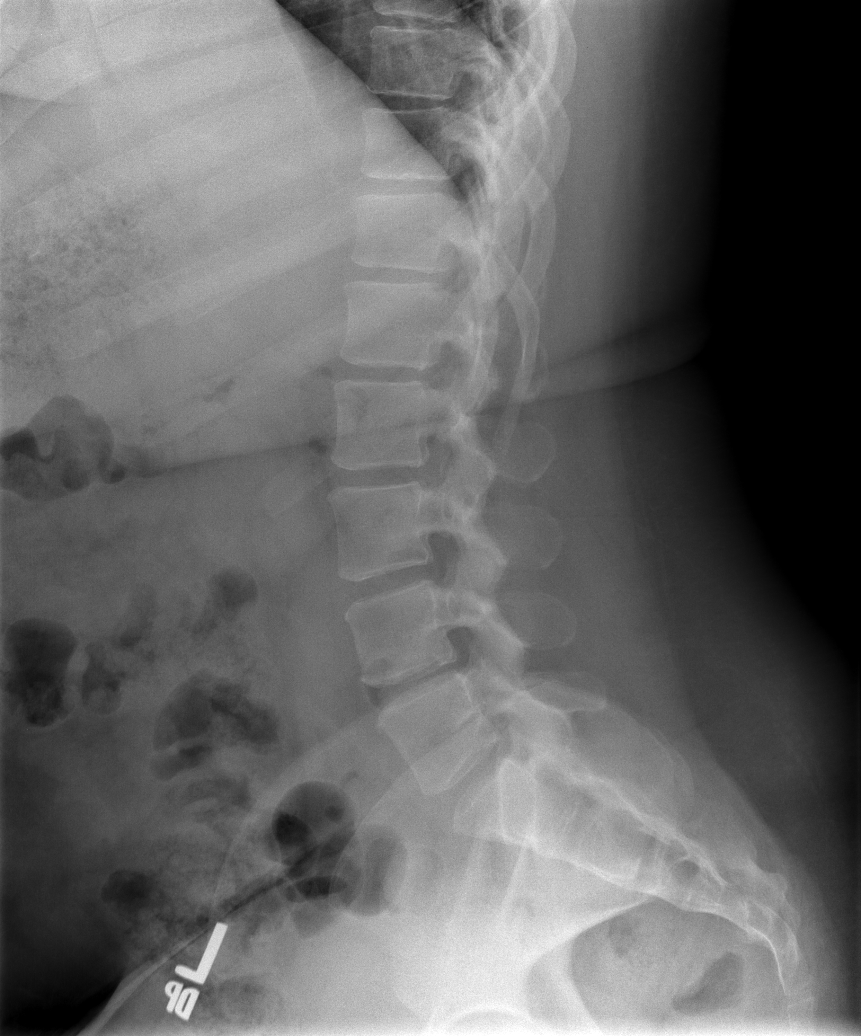

[t l-spine l5-s1 spot]
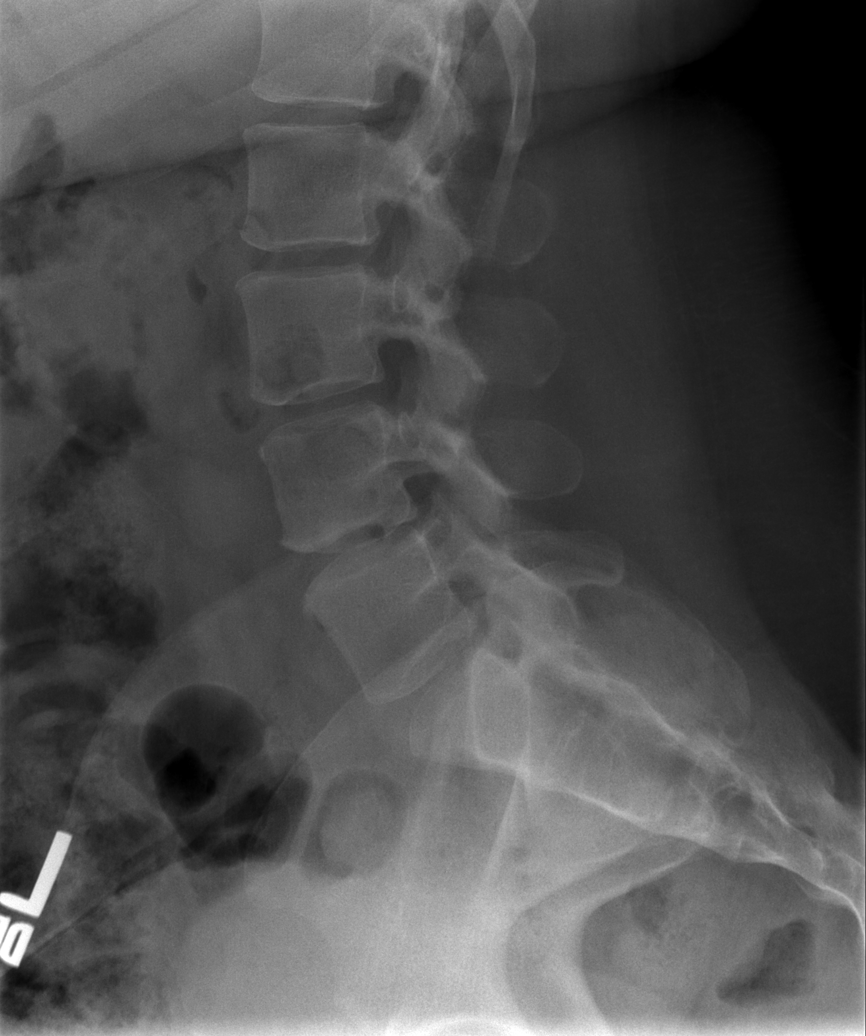

[5 of 5 positions shown; findings below may reference images not displayed]

FINDINGS: 5 non-rib-bearing lumbar vertebra.

Slight disc space narrowing L4-L5.

Vertebral body and disc space heights otherwise maintained.

No acute fracture, subluxation or bone destruction.

No spondylolysis.

Visualized pelvis intact.

Question minimal sclerosis along the SI joints.
IMPRESSION: Minimal degenerative disc disease changes L4-L5.

Cannot exclude sacroiliitis.

Otherwise negative exam.

## 2018-11-09 ENCOUNTER — Emergency Department (HOSPITAL_COMMUNITY)
Admission: EM | Admit: 2018-11-09 | Discharge: 2018-11-09 | Disposition: A | Discharge status: Home / Self Care | Payer: Self-pay | Attending: Emergency Medicine | Admitting: Emergency Medicine

## 2018-11-09 ENCOUNTER — Other Ambulatory Visit: Payer: Self-pay

## 2018-11-09 ENCOUNTER — Encounter (HOSPITAL_COMMUNITY): Payer: Self-pay | Admitting: Emergency Medicine

## 2018-11-09 DIAGNOSIS — Z79899 Other long term (current) drug therapy: Secondary | ICD-10-CM | POA: Insufficient documentation

## 2018-11-09 DIAGNOSIS — Y999 Unspecified external cause status: Secondary | ICD-10-CM | POA: Insufficient documentation

## 2018-11-09 DIAGNOSIS — E119 Type 2 diabetes mellitus without complications: Secondary | ICD-10-CM | POA: Insufficient documentation

## 2018-11-09 DIAGNOSIS — Z794 Long term (current) use of insulin: Secondary | ICD-10-CM | POA: Insufficient documentation

## 2018-11-09 DIAGNOSIS — S8991XA Unspecified injury of right lower leg, initial encounter: Secondary | ICD-10-CM

## 2018-11-09 DIAGNOSIS — S300XXA Contusion of lower back and pelvis, initial encounter: Secondary | ICD-10-CM

## 2018-11-09 DIAGNOSIS — Y929 Unspecified place or not applicable: Secondary | ICD-10-CM | POA: Insufficient documentation

## 2018-11-09 DIAGNOSIS — X58XXXA Exposure to other specified factors, initial encounter: Secondary | ICD-10-CM | POA: Insufficient documentation

## 2018-11-09 DIAGNOSIS — E039 Hypothyroidism, unspecified: Secondary | ICD-10-CM | POA: Insufficient documentation

## 2018-11-09 DIAGNOSIS — Y939 Activity, unspecified: Secondary | ICD-10-CM | POA: Insufficient documentation

## 2018-11-09 DIAGNOSIS — I10 Essential (primary) hypertension: Secondary | ICD-10-CM | POA: Insufficient documentation

## 2018-11-09 MED ORDER — TRAMADOL HCL 50 MG PO TABS: 50.0000 mg | ORAL_TABLET | Freq: Four times a day (QID) | ORAL | 0 refills | Status: AC | PRN

## 2018-11-09 MED ORDER — TRAMADOL HCL 50 MG PO TABS
100.0000 mg | ORAL_TABLET | Freq: Once | ORAL | Status: AC
  Administered 2018-11-09: 100 mg via ORAL
  Filled 2018-11-09: qty 2

## 2018-11-09 MED ORDER — ONDANSETRON HCL 4 MG PO TABS
4.0000 mg | ORAL_TABLET | Freq: Once | ORAL | Status: AC
  Administered 2018-11-09: 4 mg via ORAL
  Filled 2018-11-09: qty 1

## 2018-11-09 MED ORDER — CYCLOBENZAPRINE HCL 10 MG PO TABS: 10.0000 mg | ORAL_TABLET | Freq: Three times a day (TID) | ORAL | 0 refills | Status: DC

## 2018-11-09 MED ORDER — CYCLOBENZAPRINE HCL 10 MG PO TABS
10.0000 mg | ORAL_TABLET | Freq: Once | ORAL | Status: AC
  Administered 2018-11-09: 10 mg via ORAL
  Filled 2018-11-09: qty 1

## 2018-11-09 MED ORDER — CYCLOBENZAPRINE HCL 10 MG PO TABS: 10.0000 mg | ORAL_TABLET | Freq: Three times a day (TID) | ORAL | 0 refills | Status: AC

## 2018-11-09 NOTE — ED Triage Notes (Signed)
Patient reports falling through her bathroom floor. Hit her low back on the toilet and her R leg between the boards. Patient ambulatory to Triage without difficulty.

## 2018-11-09 NOTE — Discharge Instructions (Addendum)
Please use Flexeril 3 times daily for spasm pain.  Use Tylenol extra strength every 4 hours for mild pain.  Use Ultram for more severe pain.  Please rest her back in your leg is much as possible.  Flexeril and Ultram may cause drowsiness.  Please use these medications with caution.

## 2018-11-09 NOTE — ED Provider Notes (Signed)
[facility] EMERGENCY DEPARTMENT Provider Note   CSN: [id number] Arrival date & time: 11/09/18  1520     History   Chief Complaint Chief Complaint  Patient presents with  . Fall    HPI Sierra [name] is a 32 y.o. female.  Patient is a 32 year old female who presents to the emergency department with a complaint of back pain and leg pain.  Patient states that she fell through her bathroom floor earlier today.  She injured the right lower leg, and she injured the lower back.  The patient denies any loss of bowel or bladder control.  She has not noticed any deformity of her leg.  She is ambulatory, but states that she has pain with certain movement and sometimes with walking.  The patient denies being on any anticoagulation medications.  It is of note that she has arthritis.  The history is provided by the patient.  Fall  Pertinent negatives include no chest pain, no abdominal pain and no shortness of breath.    Past Medical History:  Diagnosis Date  . Arthritis   . Bipolar 1 disorder (HCC)   . Bipolar disorder (HCC)   . Diabetes mellitus without complication (HCC)   . GERD (gastroesophageal reflux disease)   . Headache   . Hypertension   . Hypothyroidism   . Thyroid disease     Patient Active Problem List   Diagnosis Date Noted  . S/P primary low transverse C-section 12/01/2014  . Gestational hypertension 11/29/2014    Past Surgical History:  Procedure Laterality Date  . CESAREAN SECTION N/A 11/30/2014   Procedure: CESAREAN SECTION;  Surgeon: Oliver Pila, MD;  Location: [facility];  Service: Obstetrics;  Laterality: N/A;     OB History    Gravida  4   Para  3   Term  3   Preterm      AB  1   Living  3     SAB      TAB      Ectopic      Multiple  0   Live Births  2            Home Medications    Prior to Admission medications   Medication Sig Start Date End Date Taking? Authorizing Provider  cyclobenzaprine (FLEXERIL) 10 MG  tablet Take 1 tablet (10 mg total) by mouth 3 (three) times daily as needed for muscle spasms. 06/28/16   Dione Booze, MD  ibuprofen (ADVIL,MOTRIN) 800 MG tablet Take 1 tablet (800 mg total) by mouth every 8 (eight) hours as needed. 06/28/16   Dione Booze, MD  insulin aspart (NOVOLOG) 100 UNIT/ML injection Inject 8 Units into the skin 3 (three) times daily with meals. 12/03/14   Huel Cote, MD  insulin glargine (LANTUS) 100 UNIT/ML injection Inject 0.22 mLs (22 Units total) into the skin at bedtime. Patient taking differently: Inject 20 Units into the skin at bedtime.  12/03/14   Huel Cote, MD  levothyroxine (SYNTHROID, LEVOTHROID) 200 MCG tablet Take 200 mcg by mouth daily before breakfast.     [provider]  metFORMIN (GLUCOPHAGE) 500 MG tablet Take 500 mg by mouth daily.    [provider]  oxyCODONE-acetaminophen (PERCOCET) 5-325 MG tablet Take 1 tablet by mouth every 4 (four) hours as needed for moderate pain. 06/28/16   Dione Booze, MD  Prenatal Vit-Fe Fumarate-FA (PRENATAL MULTIVITAMIN) TABS tablet Take 1 tablet by mouth daily at 12 noon.    [provider]  traMADol (ULTRAM) 50 MG tablet 1-2 tabs po q 6 hr prn pain Maximum dose= 8 tablets per day 04/12/16   Domenick Gong, MD    Family History Family History  Problem Relation Age of Onset  . Bipolar disorder Mother   . Depression Mother   . Asthma Father   . Diabetes Other   . Cancer Other   . Hypertension Other     Social History Social History   Tobacco Use  . Smoking status: Never Smoker  . Smokeless tobacco: Never Used  Substance Use Topics  . Alcohol use: No  . Drug use: Yes    Types: Marijuana    Comment: 2 days ago     Allergies   Flonase [fluticasone propionate] and Naproxen   Review of Systems Review of Systems  Constitutional: Negative for activity change.       All ROS Neg except as noted in HPI  HENT: Negative for nosebleeds.   Eyes: Negative for photophobia  and discharge.  Respiratory: Negative for cough, shortness of breath and wheezing.   Cardiovascular: Negative for chest pain and palpitations.  Gastrointestinal: Negative for abdominal pain and blood in stool.  Genitourinary: Negative for dysuria, frequency and hematuria.  Musculoskeletal: Positive for back pain. Negative for arthralgias and neck pain.  Skin: Negative.   Neurological: Negative for dizziness, seizures and speech difficulty.  Psychiatric/Behavioral: Negative for confusion and hallucinations.     Physical Exam Updated Vital Signs Ht 5\' 6"  (1.676 m)   Wt 122.5 kg   LMP 10/09/2018   BMI 43.58 kg/m   Physical Exam  Constitutional: She is oriented to person, place, and time. She appears well-developed and well-nourished.  Non-toxic appearance.  HENT:  Head: Normocephalic.  Right Ear: Tympanic membrane and external ear normal.  Left Ear: Tympanic membrane and external ear normal.  Eyes: Pupils are equal, round, and reactive to light. EOM and lids are normal.  Neck: Normal range of motion. Neck supple. Carotid bruit is not present.  Cardiovascular: Normal rate, regular rhythm, normal heart sounds, intact distal pulses and normal pulses.  Pulmonary/Chest: Breath sounds normal. No respiratory distress.  Abdominal: Soft. Bowel sounds are normal. There is no tenderness. There is no guarding.  Musculoskeletal: She exhibits tenderness.       Lumbar back: She exhibits pain and spasm.       Right lower leg: She exhibits tenderness.  There is no palpable step-off of the lumbar spine.  There is tenderness at the mid to lower lumbar spine area in the paraspinal area.  There is pain with attempted range of motion in this area.  There is soreness of the calf of the right leg.  There is no swelling.  The compartments are soft.  The dorsalis pedis pulse is 2+.  There are no open wounds.  Lymphadenopathy:       Head (right side): No submandibular adenopathy present.       Head (left  side): No submandibular adenopathy present.    She has no cervical adenopathy.  Neurological: She is alert and oriented to person, place, and time. She has normal strength. No cranial nerve deficit or sensory deficit.  Skin: Skin is warm and dry.  Psychiatric: She has a normal mood and affect. Her speech is normal.  Nursing note and vitals reviewed.    ED Treatments / Results  Labs (all labs ordered are listed, but only abnormal results are displayed) Labs Reviewed - No data to display  EKG None  Radiology  No results found.  Procedures Procedures (including critical care time)  Medications Ordered in ED Medications - No data to display   Initial Impression / Assessment and Plan / ED Course  I have reviewed the triage vital signs and the nursing notes.  Pertinent labs & imaging results that were available during my care of the patient were reviewed by me and considered in my medical decision making (see chart for details).      Final Clinical Impressions(s) / ED Diagnoses MDM  Patient fell through her bathroom floor, and hit her back on her commode.  No loss of bowel or bladder function.  No deformity of the right lower leg. Patient will be treated with Flexeril for spasm pain, and extra strength Tylenol for mild to moderate pain.  Prescription for Ultram given to the patient for more severe pain.  Patient has an allergy to NSAIDs.  [facility]   Final diagnoses:  Lumbar contusion, initial encounter  Injury of right lower leg, initial encounter    ED Discharge Orders         Ordered    cyclobenzaprine (FLEXERIL) 10 MG tablet  3 times daily,   Status:  Discontinued     11/09/18 1640    traMADol (ULTRAM) 50 MG tablet  Every 6 hours PRN     11/09/18 1640    cyclobenzaprine (FLEXERIL) 10 MG tablet  3 times daily     11/09/18 1640           Ivery QualeBryant, Arnaldo Heffron, PA-C 11/09/18 1647    Bethann BerkshireZammit, Joseph, MD 11/12/18  [id number]
